# Patient Record
Sex: Female | Born: 1980 | Race: Black or African American | Hispanic: No | Marital: Single | Smoking: Current every day smoker
Health system: Southern US, Community
[De-identification: ages and names within clinical notes are randomized; demographics above are authoritative.]

## PROBLEM LIST (undated history)

## (undated) DIAGNOSIS — F319 Bipolar disorder, unspecified: Secondary | ICD-10-CM

## (undated) DIAGNOSIS — F419 Anxiety disorder, unspecified: Secondary | ICD-10-CM

## (undated) DIAGNOSIS — K219 Gastro-esophageal reflux disease without esophagitis: Secondary | ICD-10-CM

## (undated) DIAGNOSIS — I10 Essential (primary) hypertension: Secondary | ICD-10-CM

## (undated) DIAGNOSIS — G4730 Sleep apnea, unspecified: Secondary | ICD-10-CM

## (undated) DIAGNOSIS — D649 Anemia, unspecified: Secondary | ICD-10-CM

## (undated) DIAGNOSIS — G43909 Migraine, unspecified, not intractable, without status migrainosus: Secondary | ICD-10-CM

## (undated) HISTORY — DX: Migraine, unspecified, not intractable, without status migrainosus: G43.909

## (undated) HISTORY — DX: Sleep apnea, unspecified: G47.30

## (undated) HISTORY — DX: Anemia, unspecified: D64.9

## (undated) HISTORY — DX: Bipolar disorder, unspecified: F31.9

## (undated) HISTORY — DX: Anxiety disorder, unspecified: F41.9

## (undated) HISTORY — DX: Gastro-esophageal reflux disease without esophagitis: K21.9

---

## 2004-03-04 HISTORY — PX: NEPHRECTOMY: SHX65

## 2004-05-24 ENCOUNTER — Inpatient Hospital Stay: Payer: Self-pay | Admitting: Internal Medicine

## 2006-08-15 ENCOUNTER — Emergency Department: Payer: Self-pay | Admitting: Emergency Medicine

## 2007-01-05 ENCOUNTER — Emergency Department: Payer: Self-pay | Admitting: Emergency Medicine

## 2007-04-01 ENCOUNTER — Emergency Department: Payer: Self-pay | Admitting: Emergency Medicine

## 2007-06-07 ENCOUNTER — Other Ambulatory Visit: Payer: Self-pay

## 2007-06-07 ENCOUNTER — Emergency Department: Payer: Self-pay | Admitting: Emergency Medicine

## 2008-09-12 ENCOUNTER — Emergency Department: Payer: Self-pay | Admitting: Emergency Medicine

## 2010-03-04 HISTORY — PX: TUBAL LIGATION: SHX77

## 2010-07-16 ENCOUNTER — Emergency Department: Payer: Self-pay | Admitting: Internal Medicine

## 2010-09-26 ENCOUNTER — Ambulatory Visit: Payer: Self-pay | Admitting: Emergency Medicine

## 2010-10-02 ENCOUNTER — Ambulatory Visit: Payer: Self-pay | Admitting: Emergency Medicine

## 2010-10-03 LAB — PATHOLOGY REPORT

## 2011-03-05 HISTORY — PX: CHOLECYSTECTOMY: SHX55

## 2011-11-30 ENCOUNTER — Emergency Department: Payer: Self-pay | Admitting: Emergency Medicine

## 2011-12-15 ENCOUNTER — Emergency Department: Payer: Self-pay | Admitting: Emergency Medicine

## 2011-12-15 LAB — COMPREHENSIVE METABOLIC PANEL
Alkaline Phosphatase: 64 U/L (ref 50–136)
Anion Gap: 6 — ABNORMAL LOW (ref 7–16)
Bilirubin,Total: 0.4 mg/dL (ref 0.2–1.0)
Chloride: 107 mmol/L (ref 98–107)
Co2: 27 mmol/L (ref 21–32)
EGFR (Non-African Amer.): >60
SGPT (ALT): 21 U/L (ref 12–78)
Sodium: 140 mmol/L (ref 136–145)

## 2011-12-15 LAB — URINALYSIS, COMPLETE
Bilirubin,UR: NEGATIVE
Glucose,UR: NEGATIVE mg/dL (ref 0–75)
Ketone: NEGATIVE
Nitrite: NEGATIVE
Protein: NEGATIVE
RBC,UR: 1 /HPF (ref 0–5)
Specific Gravity: 1.027 (ref 1.003–1.030)
WBC UR: 2 /HPF (ref 0–5)

## 2011-12-15 LAB — CBC
HCT: 30.3 % — ABNORMAL LOW (ref 35.0–47.0)
HGB: 9.4 g/dL — ABNORMAL LOW (ref 12.0–16.0)
RBC: 4.47 10*6/uL (ref 3.80–5.20)

## 2013-04-30 ENCOUNTER — Emergency Department: Payer: Self-pay | Admitting: Emergency Medicine

## 2013-05-02 LAB — BETA STREP CULTURE(ARMC)

## 2013-05-10 ENCOUNTER — Emergency Department: Payer: Self-pay | Admitting: Emergency Medicine

## 2013-12-14 IMAGING — CR LEFT MIDDLE FINGER 2+V
1 series · 3 of 3 positions shown · non-contrast
Comparison: none

REASON FOR EXAM: pain, swelling.
COMMENTS:

PROCEDURE:     DXR - DXR FINGER MID 3RD DIGIT LT HAND  - November 30, 2011 [DATE]
RESULT:     Findings: 3 views of the left third digit demonstrates no
fracture or dislocation. The soft tissues are normal.

[Series 1: x finger pa left · 0.14mm/px · 3 of 3 slices shown]
[im 1/3]
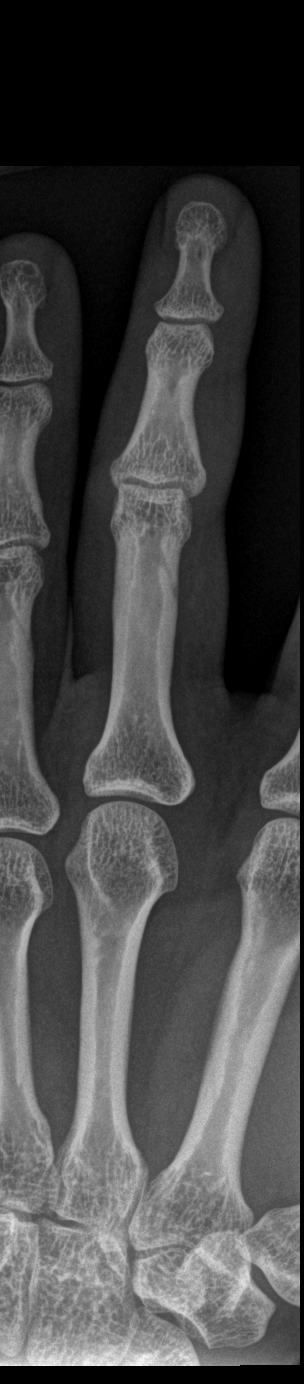
[im 2/3]
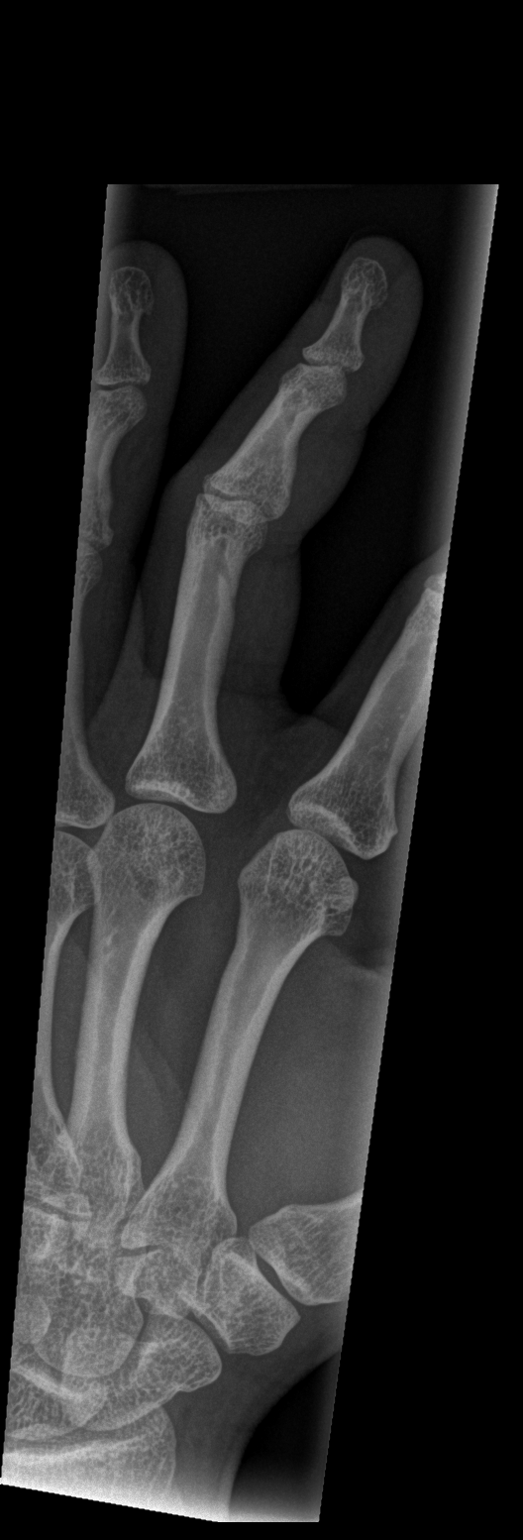
[im 3/3]
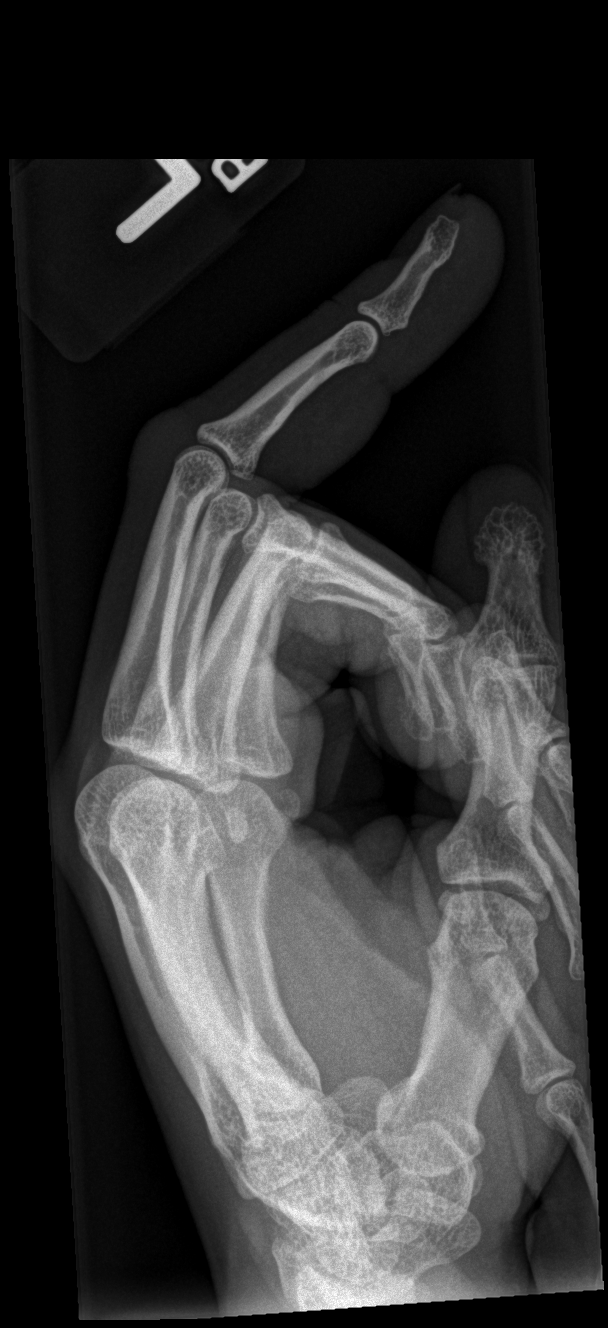

[3 of 3 positions shown; findings below may reference images not displayed]

IMPRESSION: No acute osseous injury left third digit.

## 2015-08-22 ENCOUNTER — Telehealth: Payer: Self-pay | Admitting: Surgery

## 2015-08-22 NOTE — Telephone Encounter (Signed)
[facility], needs patient brought in for I&D of right axillary abscess. Per [facility] nurse, Joice LoftsAmber, patient will be seen at 8am Wednesday 08/23/15

## 2015-08-23 ENCOUNTER — Encounter: Payer: Self-pay | Admitting: Surgery

## 2015-08-23 ENCOUNTER — Ambulatory Visit (INDEPENDENT_AMBULATORY_CARE_PROVIDER_SITE_OTHER): Payer: Self-pay | Admitting: Surgery

## 2015-08-23 VITALS — BP 120/82 | HR 90 | Temp 98.6°F | Ht 68.0 in | Wt 199.2 lb

## 2015-08-23 DIAGNOSIS — L02419 Cutaneous abscess of limb, unspecified: Secondary | ICD-10-CM

## 2015-08-23 HISTORY — PX: ABCESS DRAINAGE: SHX399

## 2015-08-23 NOTE — Progress Notes (Signed)
Patient ID: Rachel Leblanc, female   DOB: Jun 25, 1980, 35 y.o.   MRN: [id number]  History of Present Illness Rachel Leblanc is a 35 y.o. female with a history of right axillary pain and a lump. This progressively getting worse for the last few days. She reports the pain is severe is sharp intermittent and nonradiating. The pain gets worse when she moves her arm. She reports some weakness and and has a history of anemia. No fevers, no evidence of any sepsis  Past Medical History Past Medical History  Diagnosis Date  . Anxiety   . Diabetes mellitus without complication (HCC)   . GERD (gastroesophageal reflux disease)   . Anemia   . Migraines   . Bipolar disorder (HCC)   . Sleep apnea      Past Surgical History  Procedure Laterality Date  . Tubal ligation  2012  . Abcess drainage Right 08/23/15    Axilla- Dr. Everlene FarrierPabon  . Nephrectomy Left 2006  . Cholecystectomy  2013    Laparoscopic    No Known Allergies  Current Outpatient Prescriptions  Medication Sig Dispense Refill  . acetaminophen (TYLENOL) 325 MG tablet Take 650 mg by mouth every 6 (six) hours as needed.    . citalopram (CELEXA) 20 MG tablet Take 20 mg by mouth daily.    . ferrous sulfate 325 (65 FE) MG tablet Take 325 mg by mouth daily with breakfast.    . ibuprofen (ADVIL,MOTRIN) 200 MG tablet Take 600 mg by mouth every 6 (six) hours as needed.    . loratadine (CLARITIN) 10 MG tablet Take 10 mg by mouth daily.    . ranitidine (ZANTAC) 75 MG tablet Take 75 mg by mouth 2 (two) times daily.     No current facility-administered medications for this visit.    Family History Family History  Problem Relation Age of Onset  . Mental illness Mother      Social History Social History  Substance Use Topics  . Smoking status: Former Smoker -- 1.00 packs/day    Types: Cigarettes    Quit date: 08/10/2012  . Smokeless tobacco: Never Used  . Alcohol Use: No     Comment: Heavy in past- Last Use 2014    ROS 10 pt ROS was  performed and is otherwise negative  Physical Exam Blood pressure 120/82, pulse 90, temperature 98.6 F (37 C), temperature source Oral, height 5\' 8"  (1.727 m), weight 90.357 kg (199 lb 3.2 oz).  CONSTITUTIONAL: Acute distress nontoxic EYES: Pupils equal, round, and reactive to light, Sclera non-icteric. EARS, NOSE, MOUTH AND THROAT: The oropharynx is clear. Oral mucosa is pink and moist. Hearing is intact to voice.  NECK: Trachea is midline, and there is no jugular venous distension. Thyroid is without palpable abnormalities. LYMPH NODES:  Lymph nodes in the neck are not enlarged. GI: The abdomen is  soft, nontender, and nondistended. There were no palpable masses. There was no hepatosplenomegaly. There were normal bowel sounds. MUSCULOSKELETAL:  Normal muscle strength and tone in all four extremities.    SKIN: There is a tender area with fluctuance on the right axilla with some erythema. Exquisitely tender to palpation NEUROLOGIC:  Motor and sensation is grossly normal.  Cranial nerves are grossly intact. PSYCH:  Alert and oriented to person, place and time. Affect is normal.  Data Reviewed   I have personally reviewed the patient's imaging and medical records.    Assessment/Plan Axillary abscess and in need for I&D. Discussed with the patient in  detail about the procedure risks benefits and possible complications including but not limited to: Recurrence, bleeding, infection allergy reactions. She understands and a written consent was obtained. PT to pack wound daily. RTC 2 weeks   PROCEDURE NOTE Preoperative diagnosis: Right complex axillary abscess Postoperative diagnosis: Right complex axillary abscess arising from an epidermal inclusion cyst  Procedures: 1. I&D of complex right axillary abscess 2. Baby aspirin of the skin and subcutaneous tissue excisional measuring 3 x 1 cm =3cm2  Anesthesia: Adequate 1% with epinephrine  EBL: Minimal  Patient was prepped and draped in  the usual sterile fashion local anesthetic was infiltrated. An elliptical incision was created over the abscess, pus was drained.We perform an excisional debridement with Metzenbaum scissors and 15 blade knife. The collisions were breaking down with Metzenbaums. Hemostasis obtained with pressure. 1/4 inch packing placed. No complications.  Armonte Tortorella, MD FACS  Rachel Leblanc 08/23/2015, 8:54 AM

## 2015-08-23 NOTE — Patient Instructions (Signed)
Pack area once daily with packing provided.  Follow-up in office in 2 weeks. See appointment below.

## 2015-09-06 ENCOUNTER — Ambulatory Visit: Payer: Self-pay | Admitting: Surgery

## 2015-09-07 ENCOUNTER — Ambulatory Visit (INDEPENDENT_AMBULATORY_CARE_PROVIDER_SITE_OTHER): Payer: Medicaid Other | Admitting: Surgery

## 2015-09-07 ENCOUNTER — Encounter: Payer: Self-pay | Admitting: Surgery

## 2015-09-07 VITALS — BP 125/86 | HR 88 | Temp 97.4°F | Ht 68.0 in | Wt 200.0 lb

## 2015-09-07 DIAGNOSIS — L0390 Cellulitis, unspecified: Secondary | ICD-10-CM | POA: Diagnosis not present

## 2015-09-07 DIAGNOSIS — L0291 Cutaneous abscess, unspecified: Secondary | ICD-10-CM | POA: Diagnosis not present

## 2015-09-07 NOTE — Progress Notes (Signed)
S/p I/D right infected EIC Doing well , no complaints  PE NAD Right axilla, wound almost completely healed, good granulation tissue 2mm x 7 mm No infection   A/P doing well band aid prn RTC prn

## 2015-09-07 NOTE — Patient Instructions (Signed)
Please call our office if you have any questions or concerns.  

## 2016-01-11 ENCOUNTER — Emergency Department
Admission: EM | Admit: 2016-01-11 | Discharge: 2016-01-11 | Disposition: A | Discharge status: Home / Self Care | Payer: Medicaid Other | Attending: Emergency Medicine | Admitting: Emergency Medicine

## 2016-01-11 ENCOUNTER — Encounter: Payer: Self-pay | Admitting: Medical Oncology

## 2016-01-11 DIAGNOSIS — R197 Diarrhea, unspecified: Secondary | ICD-10-CM | POA: Insufficient documentation

## 2016-01-11 DIAGNOSIS — R112 Nausea with vomiting, unspecified: Secondary | ICD-10-CM | POA: Diagnosis not present

## 2016-01-11 DIAGNOSIS — J039 Acute tonsillitis, unspecified: Secondary | ICD-10-CM | POA: Insufficient documentation

## 2016-01-11 DIAGNOSIS — Z791 Long term (current) use of non-steroidal anti-inflammatories (NSAID): Secondary | ICD-10-CM | POA: Diagnosis not present

## 2016-01-11 DIAGNOSIS — Z87891 Personal history of nicotine dependence: Secondary | ICD-10-CM | POA: Insufficient documentation

## 2016-01-11 DIAGNOSIS — E119 Type 2 diabetes mellitus without complications: Secondary | ICD-10-CM | POA: Insufficient documentation

## 2016-01-11 DIAGNOSIS — Z79899 Other long term (current) drug therapy: Secondary | ICD-10-CM | POA: Diagnosis not present

## 2016-01-11 DIAGNOSIS — R509 Fever, unspecified: Secondary | ICD-10-CM | POA: Diagnosis present

## 2016-01-11 LAB — POCT RAPID STREP A: Streptococcus, Group A Screen (Direct): NEGATIVE

## 2016-01-11 MED ORDER — PROMETHAZINE-DM 6.25-15 MG/5ML PO SYRP: 5.0000 mL | ORAL_SOLUTION | Freq: Four times a day (QID) | ORAL | 0 refills | Status: DC | PRN

## 2016-01-11 MED ORDER — ACETAMINOPHEN 500 MG PO TABS
1000.0000 mg | ORAL_TABLET | Freq: Once | ORAL | Status: AC
Start: 2016-01-11 — End: 2016-01-11
  Administered 2016-01-11: 1000 mg via ORAL
  Filled 2016-01-11: qty 2

## 2016-01-11 MED ORDER — PREDNISONE 5 MG PO TABS: 30.0000 mg | ORAL_TABLET | Freq: Every day | ORAL | 0 refills | Status: DC

## 2016-01-11 MED ORDER — AMOXICILLIN 500 MG PO TABS: 500.0000 mg | ORAL_TABLET | Freq: Three times a day (TID) | ORAL | 0 refills | Status: DC

## 2016-01-11 NOTE — ED Provider Notes (Signed)
[facility] Emergency Department Provider Note  ____________________________________________  Time seen: Approximately 11:45 AM  I have reviewed the triage vital signs and the nursing notes.   HISTORY  Chief Complaint Sore Throat; Generalized Body Aches; and Fever    HPI Rachel Leblanc is a 35 y.o. female , NAD, presents with one-day history of sore throat, fever, chills, nausea, vomiting and diarrhea. Patient states she had onset of sore throat and fatigue yesterday. Woke this morning with worsening sore throat and some headache. Has been taking Tylenol which seems to help with fever and aches but only for short time. Has had some nausea today with 1 episode of emesis. Has had loss of appetite but denies abdominal pain. Has had no chest pain, shortness breath, cough, chest congestion. No ear pain or sinus pain. No known sick contacts. Has not had a flu vaccine.   Past Medical History:  Diagnosis Date  . Anemia   . Anxiety   . Bipolar disorder (HCC)   . Diabetes mellitus without complication (HCC)   . GERD (gastroesophageal reflux disease)   . Migraines   . Sleep apnea     There are no active problems to display for this patient.   Past Surgical History:  Procedure Laterality Date  . ABCESS DRAINAGE Right 08/23/15   Axilla- Dr. Everlene Farrier  . CHOLECYSTECTOMY  2013   Laparoscopic  . NEPHRECTOMY Left 2006  . TUBAL LIGATION  2012    Prior to Admission medications   Medication Sig Start Date End Date Taking? Authorizing Provider  acetaminophen (TYLENOL) 325 MG tablet Take 650 mg by mouth every 6 (six) hours as needed.    Historical Provider, MD  amoxicillin (AMOXIL) 500 MG tablet Take 1 tablet (500 mg total) by mouth 3 (three) times daily with meals. 01/11/16   Rosell Khouri L Julyana Woolverton, PA-C  citalopram (CELEXA) 20 MG tablet Take 20 mg by mouth daily.    Historical Provider, MD  ferrous sulfate 325 (65 FE) MG tablet Take 325 mg by mouth daily with breakfast.     Historical Provider, MD  ibuprofen (ADVIL,MOTRIN) 200 MG tablet Take 600 mg by mouth every 6 (six) hours as needed.    Historical Provider, MD  loratadine (CLARITIN) 10 MG tablet Take 10 mg by mouth daily.    Historical Provider, MD  predniSONE (DELTASONE) 5 MG tablet Take 6 tablets (30 mg total) by mouth daily with breakfast. May take for up to 5 days.  Do not take any NSAIDs with this medication. Take with food. 01/11/16   Kinzee Happel L Irby Fails, PA-C  promethazine-dextromethorphan (PROMETHAZINE-DM) 6.25-15 MG/5ML syrup Take 5 mLs by mouth 4 (four) times daily as needed for cough. 01/11/16   Thomasenia Dowse L Shahira Fiske, PA-C  ranitidine (ZANTAC) 75 MG tablet Take 75 mg by mouth 2 (two) times daily.    Historical Provider, MD    Allergies Patient has no known allergies.  Family History  Problem Relation Age of Onset  . Mental illness Mother     Social History Social History  Substance Use Topics  . Smoking status: Former Smoker    Packs/day: 1.00    Types: Cigarettes    Quit date: 08/10/2012  . Smokeless tobacco: Never Used  . Alcohol use No     Comment: Heavy in past- Last Use 2014     Review of Systems  Constitutional: Positive fever, chills, decreased appetite. Eyes: No visual changes. ENT: Positive sore throat. No sinus pressure, ear pain, ear drainage, nasal congestion, runny  nose Cardiovascular: No chest pain. Respiratory: No cough or chest congestion. No shortness of breath. No wheezing.  Gastrointestinal: Positive nausea, vomiting, diarrhea. No abdominal pain, constipation. Musculoskeletal: Negative for general myalgias.  Skin: Negative for rash. Neurological: Positive for headaches, but no focal weakness or numbness. 10-point ROS otherwise negative.  ____________________________________________   PHYSICAL EXAM:  VITAL SIGNS: ED Triage Vitals  Enc Vitals Group     BP 01/11/16 1109 121/81     Pulse Rate 01/11/16 1109 (!) 110     Resp 01/11/16 1109 18     Temp 01/11/16 1109 (!) 100.7  F (38.2 C)     Temp Source 01/11/16 1109 Oral     SpO2 01/11/16 1109 97 %     Weight 01/11/16 1103 197 lb (89.4 kg)     Height 01/11/16 1103 5\' 6"  (1.676 m)     Head Circumference --      Peak Flow --      Pain Score 01/11/16 1104 8     Pain Loc --      Pain Edu? --      Excl. in GC? --      Constitutional: Alert and oriented. Ill appearing but in no acute distress. Eyes: Conjunctivae are normal. PERRL.  Head: Atraumatic. ENT:      Ears: TMs visualized bilaterally without erythema, effusion, bulging.      Nose: No congestion/rhinnorhea.      Mouth/Throat: Mucous membranes are moist. Bilateral tonsils with mild swelling and gray exudate noted about the right tonsil. Uvula is midline. Airway is patent. Neck: No stridor. No cervical spine tenderness to palpation. Supple with full range of motion and no meningismus. Hematological/Lymphatic/Immunilogical: No cervical lymphadenopathy. Cardiovascular: Normal rate, regular rhythm. Normal S1 and S2.  Good peripheral circulation. Respiratory: Normal respiratory effort without tachypnea or retractions. Lungs CTAB with breath sounds noted in all lung fields. No wheeze, rhonchi, rales. Gastrointestinal: Soft and nontender without distention or guarding in all quadrants. Bowel sounds present and normoactive in all quadrants. Musculoskeletal: No lower extremity tenderness nor edema.  No joint effusions. Neurologic:  Normal speech and language. No gross focal neurologic deficits are appreciated.  Skin:  Skin is warm, dry and intact. No rash noted. Psychiatric: Mood and affect are normal. Speech and behavior are normal. Patient exhibits appropriate insight and judgement.   ____________________________________________   LABS (all labs ordered are listed, but only abnormal results are displayed)  Labs Reviewed  POCT RAPID STREP A    ____________________________________________  EKG  None ____________________________________________  RADIOLOGY  None ____________________________________________    PROCEDURES  Procedure(s) performed: None   Procedures   Medications  acetaminophen (TYLENOL) tablet 1,000 mg (1,000 mg Oral Given 01/11/16 1150)     ____________________________________________   INITIAL IMPRESSION / ASSESSMENT AND PLAN / ED COURSE  Pertinent labs & imaging results that were available during my care of the patient were reviewed by me and considered in my medical decision making (see chart for details).  Clinical Course     Patient's diagnosis is consistent with Acute tonsillitis. Patient will be discharged home with prescriptions for amoxicillin, prednisone and promethazine DM to take as directed. [facility] if symptoms persist past this treatment course. Patient is given ED precautions to return to the ED for any worsening or new symptoms.    ____________________________________________  FINAL CLINICAL IMPRESSION(S) / ED DIAGNOSES  Final diagnoses:  Acute tonsillitis, unspecified etiology      NEW MEDICATIONS STARTED  DURING THIS VISIT:  New Prescriptions   AMOXICILLIN (AMOXIL) 500 MG TABLET    Take 1 tablet (500 mg total) by mouth 3 (three) times daily with meals.   PREDNISONE (DELTASONE) 5 MG TABLET    Take 6 tablets (30 mg total) by mouth daily with breakfast. May take for up to 5 days.  Do not take any NSAIDs with this medication. Take with food.   PROMETHAZINE-DEXTROMETHORPHAN (PROMETHAZINE-DM) 6.25-15 MG/5ML SYRUP    Take 5 mLs by mouth 4 (four) times daily as needed for cough.         Hope PigeonJami L Rabiah Goeser, PA-C 01/11/16 1604    Governor Rooksebecca Lord, MD 01/12/16 1135

## 2016-01-11 NOTE — ED Notes (Signed)
Patient presents to the ED with sore throat, cough, fever, vomiting, and diarrhea.  Patient states symptoms began 2 days ago.  Patient reports aching and chills.

## 2016-01-11 NOTE — ED Triage Notes (Signed)
Pt reports sore throat, body aches and fever that began 2 days ago.

## 2016-02-13 ENCOUNTER — Encounter: Payer: Self-pay | Admitting: Emergency Medicine

## 2016-02-13 ENCOUNTER — Emergency Department
Admission: EM | Admit: 2016-02-13 | Discharge: 2016-02-13 | Disposition: A | Discharge status: Home / Self Care | Payer: Medicaid Other | Attending: Emergency Medicine | Admitting: Emergency Medicine

## 2016-02-13 ENCOUNTER — Emergency Department: Payer: Medicaid Other

## 2016-02-13 DIAGNOSIS — R112 Nausea with vomiting, unspecified: Secondary | ICD-10-CM | POA: Diagnosis present

## 2016-02-13 DIAGNOSIS — E119 Type 2 diabetes mellitus without complications: Secondary | ICD-10-CM | POA: Diagnosis not present

## 2016-02-13 DIAGNOSIS — R1010 Upper abdominal pain, unspecified: Secondary | ICD-10-CM

## 2016-02-13 DIAGNOSIS — R52 Pain, unspecified: Secondary | ICD-10-CM

## 2016-02-13 DIAGNOSIS — Z87891 Personal history of nicotine dependence: Secondary | ICD-10-CM | POA: Insufficient documentation

## 2016-02-13 LAB — CBC WITH DIFFERENTIAL/PLATELET
Basophils Absolute: 0 10*3/uL (ref 0–0.1)
Basophils Relative: 1 %
Eosinophils Absolute: 0.3 10*3/uL (ref 0–0.7)
Eosinophils Relative: 5 %
HCT: 30.9 % — ABNORMAL LOW (ref 35.0–47.0)
HEMOGLOBIN: 9.7 g/dL — AB (ref 12.0–16.0)
LYMPHS ABS: 2 10*3/uL (ref 1.0–3.6)
LYMPHS PCT: 34 %
MCH: 21.7 pg — AB (ref 26.0–34.0)
MCHC: 31.6 g/dL — ABNORMAL LOW (ref 32.0–36.0)
MCV: 68.8 fL — AB (ref 80.0–100.0)
Monocytes Absolute: 0.5 10*3/uL (ref 0.2–0.9)
Monocytes Relative: 8 %
NEUTROS PCT: 52 %
Neutro Abs: 3 10*3/uL (ref 1.4–6.5)
Platelets: 374 10*3/uL (ref 150–440)
RBC: 4.48 MIL/uL (ref 3.80–5.20)
RDW: 18.6 % — ABNORMAL HIGH (ref 11.5–14.5)
WBC: 5.7 10*3/uL (ref 3.6–11.0)

## 2016-02-13 LAB — URINALYSIS, COMPLETE (UACMP) WITH MICROSCOPIC
BILIRUBIN URINE: NEGATIVE
Glucose, UA: NEGATIVE mg/dL
HGB URINE DIPSTICK: NEGATIVE
Ketones, ur: 5 mg/dL — AB
NITRITE: NEGATIVE
PROTEIN: 30 mg/dL — AB
Specific Gravity, Urine: 1.023 (ref 1.005–1.030)
pH: 5 (ref 5.0–8.0)

## 2016-02-13 LAB — COMPREHENSIVE METABOLIC PANEL
ALT: 27 U/L (ref 14–54)
ANION GAP: 6 (ref 5–15)
AST: 34 U/L (ref 15–41)
Albumin: 3.6 g/dL (ref 3.5–5.0)
Alkaline Phosphatase: 43 U/L (ref 38–126)
BUN: 7 mg/dL (ref 6–20)
CHLORIDE: 107 mmol/L (ref 101–111)
CO2: 23 mmol/L (ref 22–32)
Calcium: 8.6 mg/dL — ABNORMAL LOW (ref 8.9–10.3)
Creatinine, Ser: 0.85 mg/dL (ref 0.44–1.00)
GFR calc non Af Amer: >60 mL/min (ref >60)
Glucose, Bld: 121 mg/dL — ABNORMAL HIGH (ref 65–99)
Potassium: 2.9 mmol/L — ABNORMAL LOW (ref 3.5–5.1)
SODIUM: 136 mmol/L (ref 135–145)
Total Bilirubin: 0.3 mg/dL (ref 0.3–1.2)
Total Protein: 7.1 g/dL (ref 6.5–8.1)

## 2016-02-13 LAB — LIPASE, BLOOD: Lipase: 40 U/L (ref 11–51)

## 2016-02-13 LAB — PREGNANCY, URINE: Preg Test, Ur: NEGATIVE

## 2016-02-13 LAB — POCT PREGNANCY, URINE: PREG TEST UR: NEGATIVE

## 2016-02-13 MED ORDER — RANITIDINE HCL 150 MG PO TABS: 150.0000 mg | ORAL_TABLET | Freq: Two times a day (BID) | ORAL | 1 refills | Status: DC

## 2016-02-13 MED ORDER — IPRATROPIUM-ALBUTEROL 0.5-2.5 (3) MG/3ML IN SOLN
RESPIRATORY_TRACT | Status: AC
  Administered 2016-02-13: 3 mL via RESPIRATORY_TRACT
  Filled 2016-02-13: qty 3

## 2016-02-13 MED ORDER — ACETAMINOPHEN 500 MG PO TABS
1000.0000 mg | ORAL_TABLET | Freq: Once | ORAL | Status: AC
  Administered 2016-02-13: 1000 mg via ORAL

## 2016-02-13 MED ORDER — ACETAMINOPHEN 500 MG PO TABS
ORAL_TABLET | ORAL | Status: AC
  Administered 2016-02-13: 1000 mg via ORAL
  Filled 2016-02-13: qty 2

## 2016-02-13 MED ORDER — GI COCKTAIL ~~LOC~~
30.0000 mL | Freq: Once | ORAL | Status: AC
  Administered 2016-02-13: 30 mL via ORAL
  Filled 2016-02-13: qty 30

## 2016-02-13 MED ORDER — IPRATROPIUM-ALBUTEROL 0.5-2.5 (3) MG/3ML IN SOLN
3.0000 mL | Freq: Once | RESPIRATORY_TRACT | Status: AC
  Administered 2016-02-13: 3 mL via RESPIRATORY_TRACT

## 2016-02-13 NOTE — ED Notes (Signed)
Patient transported to X-ray 

## 2016-02-13 NOTE — ED Notes (Signed)
Pt resting in bed, resp even and unlabored, eyes closed 

## 2016-02-13 NOTE — ED Notes (Signed)
Pt returned from  Xray, resting in bed in no acute distress

## 2016-02-13 NOTE — ED Triage Notes (Signed)
Pt to ed with c/o abd pain and n/v/d since Sunday.  Pt states yellow vomitus.

## 2016-02-13 NOTE — ED Notes (Signed)
Pt resting in bed with eyes closed, resp even and unlabored.

## 2016-02-13 NOTE — Discharge Instructions (Signed)
Since the pain went away with the GI cocktail and think he may have had some gastritis or something similar. I will give you some Zantac. Please take one twice a day for the next several weeks. Please follow-up with your doctor. Please return here if you're worse at all especially for fever or vomiting. He don't have a regular doctor please [facility] or [facility], [facility], [facility] or [facility].

## 2016-02-13 NOTE — ED Provider Notes (Signed)
[facility] Emergency Department Provider Note   ____________________________________________   First MD Initiated Contact with Patient 02/13/16 0840     (approximate)  I have reviewed the triage vital signs and the nursing notes.   HISTORY  Chief Complaint Abdominal Pain   HPI Rachel Leblanc is a 35 y.o. female who reports nausea vomiting and diarrhea since Sunday. She is also complained of cough productive of small amounts of sputum. Main complaint however is epigastric pain. This resolved with GI cocktail. During the time the patient is waiting for the blood work to come back and then waiting for me to get done with other patients patient falls asleep she has no nausea vomiting or diarrhea no further coughing in the epigastric pain as I said resolved.   Past Medical History:  Diagnosis Date  . Anemia   . Anxiety   . Bipolar disorder (HCC)   . Diabetes mellitus without complication (HCC)   . GERD (gastroesophageal reflux disease)   . Migraines   . Sleep apnea     There are no active problems to display for this patient.   Past Surgical History:  Procedure Laterality Date  . ABCESS DRAINAGE Right 08/23/15   Axilla- Dr. Everlene Farrier  . CHOLECYSTECTOMY  2013   Laparoscopic  . NEPHRECTOMY Left 2006  . TUBAL LIGATION  2012    Prior to Admission medications   Medication Sig Start Date End Date Taking? Authorizing Provider  acetaminophen (TYLENOL) 325 MG tablet Take 650 mg by mouth every 6 (six) hours as needed.    Historical Provider, MD  amoxicillin (AMOXIL) 500 MG tablet Take 1 tablet (500 mg total) by mouth 3 (three) times daily with meals. 01/11/16   Jami L Hagler, PA-C  citalopram (CELEXA) 20 MG tablet Take 20 mg by mouth daily.    Historical Provider, MD  ferrous sulfate 325 (65 FE) MG tablet Take 325 mg by mouth daily with breakfast.    Historical Provider, MD  ibuprofen (ADVIL,MOTRIN) 200 MG tablet Take 600 mg by mouth every 6 (six) hours as  needed.    Historical Provider, MD  loratadine (CLARITIN) 10 MG tablet Take 10 mg by mouth daily.    Historical Provider, MD  predniSONE (DELTASONE) 5 MG tablet Take 6 tablets (30 mg total) by mouth daily with breakfast. May take for up to 5 days.  Do not take any NSAIDs with this medication. Take with food. 01/11/16   Jami L Hagler, PA-C  promethazine-dextromethorphan (PROMETHAZINE-DM) 6.25-15 MG/5ML syrup Take 5 mLs by mouth 4 (four) times daily as needed for cough. 01/11/16   Jami L Hagler, PA-C  ranitidine (ZANTAC) 150 MG tablet Take 1 tablet (150 mg total) by mouth 2 (two) times daily. 02/13/16 02/12/17  Arnaldo Natal, MD    Allergies Patient has no known allergies.  Family History  Problem Relation Age of Onset  . Mental illness Mother     Social History Social History  Substance Use Topics  . Smoking status: Former Smoker    Packs/day: 1.00    Types: Cigarettes    Quit date: 08/10/2012  . Smokeless tobacco: Never Used  . Alcohol use No     Comment: Heavy in past- Last Use 2014    Review of Systems Constitutional: No fever Eyes: No visual changes. ENT: No sore throat. Cardiovascular: Denies chest pain. Respiratory: Denies shortness of breath. Gastrointestinal: See history of present illness Genitourinary: Negative for dysuria. Musculoskeletal: Negative for back pain. Skin: Negative for  rash.  10-point ROS otherwise negative.  ____________________________________________   PHYSICAL EXAM:  VITAL SIGNS: ED Triage Vitals  Enc Vitals Group     BP 02/13/16 0822 (!) 133/91     Pulse Rate 02/13/16 0822 95     Resp 02/13/16 0822 20     Temp 02/13/16 0822 99.2 F (37.3 C)     Temp Source 02/13/16 0822 Oral     SpO2 02/13/16 0822 100 %     Weight 02/13/16 0823 197 lb (89.4 kg)     Height --      Head Circumference --      Peak Flow --      Pain Score 02/13/16 0823 8     Pain Loc --      Pain Edu? --      Excl. in GC? --     Constitutional: Alert and oriented.  Well appearing and in no acute distress. Eyes: Conjunctivae are normal. PERRL. EOMI. Head: Atraumatic. Nose: No congestion/rhinnorhea. Mouth/Throat: Mucous membranes are moist.  Oropharynx non-erythematous. Neck: No stridor.   Cardiovascular: Normal rate, regular rhythm. Grossly normal heart sounds.  Good peripheral circulation. Respiratory: Normal respiratory effort.  No retractions. Lungs CTAB. Gastrointestinal: Soft and nontender except in epigastric area. Epigastrium is tender to palpation but not to percussion.. No distention. No abdominal bruits. No CVA tenderness. Musculoskeletal: No lower extremity tenderness nor edema.  No joint effusions. Skin:  Skin is warm, dry and intact. No rash noted.  ____________________________________________   LABS (all labs ordered are listed, but only abnormal results are displayed)  Labs Reviewed  URINALYSIS, COMPLETE (UACMP) WITH MICROSCOPIC - Abnormal; Notable for the following:       Result Value   Color, Urine YELLOW (*)    APPearance HAZY (*)    Ketones, ur 5 (*)    Protein, ur 30 (*)    Leukocytes, UA SMALL (*)    Bacteria, UA RARE (*)    Squamous Epithelial / LPF 6-30 (*)    All other components within normal limits  COMPREHENSIVE METABOLIC PANEL - Abnormal; Notable for the following:    Potassium 2.9 (*)    Glucose, Bld 121 (*)    Calcium 8.6 (*)    All other components within normal limits  CBC WITH DIFFERENTIAL/PLATELET - Abnormal; Notable for the following:    Hemoglobin 9.7 (*)    HCT 30.9 (*)    MCV 68.8 (*)    MCH 21.7 (*)    MCHC 31.6 (*)    RDW 18.6 (*)    All other components within normal limits  GASTROINTESTINAL PANEL BY PCR, STOOL (REPLACES STOOL CULTURE)  PREGNANCY, URINE  LIPASE, BLOOD  POCT PREGNANCY, URINE   ____________________________________________  EKG   ____________________________________________  RADIOLOGY Study Result   CLINICAL DATA:  Abdominal pain with nausea and vomiting for 2  days, initial encounter  EXAM: ABDOMEN - 2 VIEW  COMPARISON:  None.  FINDINGS: Scattered large and small bowel gas is noted. No obstructive changes or free air are noted. No abnormal mass or abnormal calcifications are seen. 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  IMPRESSION: No acute abnormality noted.   Electronically Signed   By: Alcide CleverMark  Lukens M.D.   On: 02/13/2016 09:26    Study Result   CLINICAL DATA:  Productive cough and fever  EXAM: CHEST  2 VIEW  COMPARISON:  10/02/2010  FINDINGS: The heart size and mediastinal contours are within normal limits. Both lungs are clear. The visualized skeletal structures  are unremarkable.  IMPRESSION: No active cardiopulmonary disease.   Electronically Signed   By: Alcide CleverMark  Lukens M.D.   On: 02/13/2016 09:21     ____________________________________________   PROCEDURES  Procedure(s) performed:   Procedures  Critical Care performed:   ____________________________________________   INITIAL IMPRESSION / ASSESSMENT AND PLAN / ED COURSE  Pertinent labs & imaging results that were available during my care of the patient were reviewed by me and considered in my medical decision making (see chart for details).    Clinical Course      ____________________________________________   FINAL CLINICAL IMPRESSION(S) / ED DIAGNOSES  Final diagnoses:  Pain  Pain of upper abdomen      NEW MEDICATIONS STARTED DURING THIS VISIT:  Discharge Medication List as of 02/13/2016 12:32 PM       Note:  This document was prepared using Dragon voice recognition software and may include unintentional dictation errors.    Arnaldo NatalPaul F Teejay Meader, MD 02/13/16 [phone]

## 2016-12-24 ENCOUNTER — Emergency Department
Admission: EM | Admit: 2016-12-24 | Discharge: 2016-12-24 | Disposition: A | Discharge status: Home / Self Care | Payer: Medicaid Other | Attending: Emergency Medicine | Admitting: Emergency Medicine

## 2016-12-24 ENCOUNTER — Encounter: Payer: Self-pay | Admitting: Emergency Medicine

## 2016-12-24 DIAGNOSIS — Z79899 Other long term (current) drug therapy: Secondary | ICD-10-CM | POA: Insufficient documentation

## 2016-12-24 DIAGNOSIS — E119 Type 2 diabetes mellitus without complications: Secondary | ICD-10-CM | POA: Insufficient documentation

## 2016-12-24 DIAGNOSIS — F17210 Nicotine dependence, cigarettes, uncomplicated: Secondary | ICD-10-CM | POA: Diagnosis not present

## 2016-12-24 DIAGNOSIS — L02411 Cutaneous abscess of right axilla: Secondary | ICD-10-CM | POA: Diagnosis present

## 2016-12-24 LAB — COMPREHENSIVE METABOLIC PANEL
ALK PHOS: 31 U/L — AB (ref 38–126)
ALT: 12 U/L — AB (ref 14–54)
ANION GAP: 8 (ref 5–15)
AST: 16 U/L (ref 15–41)
Albumin: 3.8 g/dL (ref 3.5–5.0)
BILIRUBIN TOTAL: 0.2 mg/dL — AB (ref 0.3–1.2)
BUN: 16 mg/dL (ref 6–20)
CALCIUM: 8.9 mg/dL (ref 8.9–10.3)
CO2: 25 mmol/L (ref 22–32)
CREATININE: 0.86 mg/dL (ref 0.44–1.00)
Chloride: 105 mmol/L (ref 101–111)
Glucose, Bld: 106 mg/dL — ABNORMAL HIGH (ref 65–99)
Potassium: 3.3 mmol/L — ABNORMAL LOW (ref 3.5–5.1)
Sodium: 138 mmol/L (ref 135–145)
TOTAL PROTEIN: 7.2 g/dL (ref 6.5–8.1)

## 2016-12-24 LAB — CBC WITH DIFFERENTIAL/PLATELET
BASOS ABS: 0.1 10*3/uL (ref 0–0.1)
BASOS PCT: 1 %
EOS ABS: 0.2 10*3/uL (ref 0–0.7)
Eosinophils Relative: 3 %
HCT: 28 % — ABNORMAL LOW (ref 35.0–47.0)
HEMOGLOBIN: 8.7 g/dL — AB (ref 12.0–16.0)
Lymphocytes Relative: 49 %
Lymphs Abs: 2.6 10*3/uL (ref 1.0–3.6)
MCH: 20.6 pg — ABNORMAL LOW (ref 26.0–34.0)
MCHC: 31.2 g/dL — AB (ref 32.0–36.0)
MCV: 66.2 fL — ABNORMAL LOW (ref 80.0–100.0)
Monocytes Absolute: 0.4 10*3/uL (ref 0.2–0.9)
Monocytes Relative: 8 %
NEUTROS ABS: 2 10*3/uL (ref 1.4–6.5)
NEUTROS PCT: 39 %
Platelets: 362 10*3/uL (ref 150–440)
RBC: 4.22 MIL/uL (ref 3.80–5.20)
RDW: 21.1 % — ABNORMAL HIGH (ref 11.5–14.5)
WBC: 5.2 10*3/uL (ref 3.6–11.0)

## 2016-12-24 LAB — LACTIC ACID, PLASMA: Lactic Acid, Venous: 0.7 mmol/L (ref 0.5–1.9)

## 2016-12-24 MED ORDER — CLINDAMYCIN HCL 300 MG PO CAPS: 300.0000 mg | ORAL_CAPSULE | Freq: Three times a day (TID) | ORAL | 0 refills | Status: AC

## 2016-12-24 MED ORDER — HYDROCODONE-ACETAMINOPHEN 5-325 MG PO TABS: 1.0000 | ORAL_TABLET | Freq: Four times a day (QID) | ORAL | 0 refills | Status: AC | PRN

## 2016-12-24 MED ORDER — HYDROCODONE-ACETAMINOPHEN 5-325 MG PO TABS
1.0000 | ORAL_TABLET | Freq: Once | ORAL | Status: AC
  Administered 2016-12-24: 1 via ORAL

## 2016-12-24 MED ORDER — HYDROCODONE-ACETAMINOPHEN 5-325 MG PO TABS
ORAL_TABLET | ORAL | Status: AC
  Filled 2016-12-24: qty 1

## 2016-12-24 NOTE — ED Provider Notes (Signed)
[facility] Emergency Department Provider Note  ____________________________________________  Time seen: Approximately 6:43 PM  I have reviewed the triage vital signs and the nursing notes.   HISTORY  Chief Complaint Abscess    HPI Rachel EasterlyKameeka R Leblanc is a 36 y.o. female presents to the emergency department with a right axillary abscess. Patient reports that she underwent incision and drainage for an abscess in a similar location approximately one year ago. Patient states that cyst subsequently developed and has recently become painful. She denies fever or chills. No alleviating measures have been attempted.   Past Medical History:  Diagnosis Date  . Anemia   . Anxiety   . Bipolar disorder (HCC)   . Diabetes mellitus without complication (HCC)   . GERD (gastroesophageal reflux disease)   . Migraines   . Sleep apnea     There are no active problems to display for this patient.   Past Surgical History:  Procedure Laterality Date  . ABCESS DRAINAGE Right 08/23/15   Axilla- Dr. Everlene FarrierPabon  . CHOLECYSTECTOMY  2013   Laparoscopic  . NEPHRECTOMY Left 2006  . TUBAL LIGATION  2012    Prior to Admission medications   Medication Sig Start Date End Date Taking? Authorizing Provider  acetaminophen (TYLENOL) 325 MG tablet Take 650 mg by mouth every 6 (six) hours as needed.    [provider]  amoxicillin (AMOXIL) 500 MG tablet Take 1 tablet (500 mg total) by mouth 3 (three) times daily with meals. 01/11/16   Hagler, Jami L, PA-C  citalopram (CELEXA) 20 MG tablet Take 20 mg by mouth daily.    [provider]  clindamycin (CLEOCIN) 300 MG capsule Take 1 capsule (300 mg total) by mouth 3 (three) times daily. 12/24/16 01/03/17  Orvil FeilWoods, Quinci Gavidia M, PA-C  ferrous sulfate 325 (65 FE) MG tablet Take 325 mg by mouth daily with breakfast.    [provider]  HYDROcodone-acetaminophen (NORCO) 5-325 MG tablet Take 1 tablet by mouth every 6 (six) hours as  needed for moderate pain. 12/24/16 12/26/16  Orvil FeilWoods, Sanye Ledesma M, PA-C  ibuprofen (ADVIL,MOTRIN) 200 MG tablet Take 600 mg by mouth every 6 (six) hours as needed.    [provider]  loratadine (CLARITIN) 10 MG tablet Take 10 mg by mouth daily.    [provider]  predniSONE (DELTASONE) 5 MG tablet Take 6 tablets (30 mg total) by mouth daily with breakfast. May take for up to 5 days.  Do not take any NSAIDs with this medication. Take with food. 01/11/16   Hagler, Jami L, PA-C  promethazine-dextromethorphan (PROMETHAZINE-DM) 6.25-15 MG/5ML syrup Take 5 mLs by mouth 4 (four) times daily as needed for cough. 01/11/16   Hagler, Jami L, PA-C  ranitidine (ZANTAC) 150 MG tablet Take 1 tablet (150 mg total) by mouth 2 (two) times daily. 02/13/16 02/12/17  Arnaldo NatalMalinda, Paul F, MD    Allergies Patient has no known allergies.  Family History  Problem Relation Age of Onset  . Mental illness Mother     Social History Social History  Substance Use Topics  . Smoking status: Current Every Day Smoker    Packs/day: 0.50    Types: Cigarettes  . Smokeless tobacco: Never Used  . Alcohol use No     Comment: Heavy in past- Last Use 2014     Review of Systems  Constitutional: No fever/chills Eyes: No visual changes. No discharge ENT: No upper respiratory complaints. Cardiovascular: no chest pain. Respiratory: no cough. No SOB. Gastrointestinal: No  abdominal pain.  No nausea, no vomiting.  No diarrhea.  No constipation. Musculoskeletal: Negative for musculoskeletal pain. Skin: Patient has right axillary abscess.  Neurological: Negative for headaches, focal weakness or numbness.   ____________________________________________   PHYSICAL EXAM:  VITAL SIGNS: ED Triage Vitals [12/24/16 1812]  Enc Vitals Group     BP 138/82     Pulse Rate 96     Resp 18     Temp 98.1 F (36.7 C)     Temp Source Oral     SpO2 100 %     Weight      Height 5\' 6"  (1.676 m)     Head Circumference       Peak Flow      Pain Score 7     Pain Loc      Pain Edu?      Excl. in GC?      Constitutional: Alert and oriented. Well appearing and in no acute distress. Eyes: Conjunctivae are normal. PERRL. EOMI. Head: Atraumatic. Cardiovascular: Normal rate, regular rhythm. Normal S1 and S2.  Good peripheral circulation. Respiratory: Normal respiratory effort without tachypnea or retractions. Lungs CTAB. Good air entry to the bases with no decreased or absent breath sounds. Musculoskeletal: Full range of motion to all extremities. No gross deformities appreciated. Neurologic:  Normal speech and language. No gross focal neurologic deficits are appreciated.  Skin: Patient has a 3cm x 3cm right axillary abscess with no surrounding cellulitis.  Psychiatric: Mood and affect are normal. Speech and behavior are normal. Patient exhibits appropriate insight and judgement.   ____________________________________________   LABS (all labs ordered are listed, but only abnormal results are displayed)  Labs Reviewed  COMPREHENSIVE METABOLIC PANEL - Abnormal; Notable for the following:       Result Value   Potassium 3.3 (*)    Glucose, Bld 106 (*)    ALT 12 (*)    Alkaline Phosphatase 31 (*)    Total Bilirubin 0.2 (*)    All other components within normal limits  CBC WITH DIFFERENTIAL/PLATELET - Abnormal; Notable for the following:    Hemoglobin 8.7 (*)    HCT 28.0 (*)    MCV 66.2 (*)    MCH 20.6 (*)    MCHC 31.2 (*)    RDW 21.1 (*)    All other components within normal limits  LACTIC ACID, PLASMA  LACTIC ACID, PLASMA   ____________________________________________  EKG   ____________________________________________  RADIOLOGY   No results found.  ____________________________________________    PROCEDURES  Procedure(s) performed:    Procedures  INCISION AND DRAINAGE Performed by: Orvil Feil Consent: Verbal consent obtained. Risks and benefits: risks, benefits and  alternatives were discussed Type: abscess  Body area: Right axilla   Anesthesia: local infiltration  Incision was made with a scalpel.  Local anesthetic: lidocaine 1% without epinephrine  Anesthetic total: 3 ml  Complexity: complex Blunt dissection to break up loculations  Drainage: purulent  Drainage amount: Copious  Patient tolerance: Patient tolerated the procedure well with no immediate complications.   Medications  HYDROcodone-acetaminophen (NORCO/VICODIN) 5-325 MG per tablet 1 tablet (1 tablet Oral Given 12/24/16 2001)     ____________________________________________   INITIAL IMPRESSION / ASSESSMENT AND PLAN / ED COURSE  Pertinent labs & imaging results that were available during my care of the patient were reviewed by me and considered in my medical decision making (see chart for details).  Review of the [place] CSRS was performed in accordance of the [facility] prior  to dispensing any controlled drugs.     Assessment and Plan: Right axillary abscess Differential diagnosis includes cellulitis versus abscess.  Patient presents to the emergency department with a right axillary abscess. Patient underwent incision and drainage in the emergency department. She was discharged with clindamycin. Patient was advised to follow-up with primary care as needed. All patient questions were answered.  ____________________________________________  FINAL CLINICAL IMPRESSION(S) / ED DIAGNOSES  Final diagnoses:  Abscess of axilla, right      NEW MEDICATIONS STARTED DURING THIS VISIT:  Discharge Medication List as of 12/24/2016  7:44 PM    START taking these medications   Details  clindamycin (CLEOCIN) 300 MG capsule Take 1 capsule (300 mg total) by mouth 3 (three) times daily., Starting Tue 12/24/2016, Until Fri 01/03/2017, Print            This chart was dictated using voice recognition software/Dragon. Despite best efforts to proofread, errors can occur which can  change the meaning. Any change was purely unintentional.    Gasper Lloyd 12/24/16 2009    Merrily Brittle, MD 12/24/16 2344

## 2016-12-24 NOTE — ED Notes (Signed)
Patient states she had cyst un der right arm drained and packed last year. Cyst was not removed, filling of area started about a week ago. Patient states " Area Originally hard, now feels like putty, and is softer"

## 2016-12-24 NOTE — ED Triage Notes (Signed)
Patient presents to ED via POV from home with c/o abscess under right arm.

## 2017-02-10 ENCOUNTER — Encounter: Payer: Self-pay | Admitting: Obstetrics and Gynecology

## 2017-04-03 ENCOUNTER — Encounter: Payer: Self-pay | Admitting: Emergency Medicine

## 2017-04-03 ENCOUNTER — Emergency Department
Admission: EM | Admit: 2017-04-03 | Discharge: 2017-04-03 | Disposition: A | Discharge status: Home / Self Care | Payer: Medicaid Other | Attending: Emergency Medicine | Admitting: Emergency Medicine

## 2017-04-03 DIAGNOSIS — M436 Torticollis: Secondary | ICD-10-CM | POA: Insufficient documentation

## 2017-04-03 DIAGNOSIS — M62838 Other muscle spasm: Secondary | ICD-10-CM | POA: Diagnosis not present

## 2017-04-03 DIAGNOSIS — Z79899 Other long term (current) drug therapy: Secondary | ICD-10-CM | POA: Diagnosis not present

## 2017-04-03 DIAGNOSIS — E119 Type 2 diabetes mellitus without complications: Secondary | ICD-10-CM | POA: Insufficient documentation

## 2017-04-03 DIAGNOSIS — F17210 Nicotine dependence, cigarettes, uncomplicated: Secondary | ICD-10-CM | POA: Insufficient documentation

## 2017-04-03 DIAGNOSIS — M542 Cervicalgia: Secondary | ICD-10-CM | POA: Diagnosis present

## 2017-04-03 MED ORDER — PREDNISONE 10 MG (21) PO TBPK: ORAL_TABLET | ORAL | 0 refills | Status: DC

## 2017-04-03 MED ORDER — HYDROCODONE-ACETAMINOPHEN 5-325 MG PO TABS
1.0000 | ORAL_TABLET | Freq: Once | ORAL | Status: AC
Start: 2017-04-03 — End: 2017-04-03
  Administered 2017-04-03: 1 via ORAL
  Filled 2017-04-03: qty 1

## 2017-04-03 MED ORDER — BACLOFEN 10 MG PO TABS: 10.0000 mg | ORAL_TABLET | Freq: Every day | ORAL | 1 refills | Status: DC

## 2017-04-03 NOTE — ED Triage Notes (Signed)
Patient presents to ED via POV from home with c/o neck pain x 2 days. Patient also reports left arm pain, in deltoid area. Patient reports her normal speech. Patient denies recent injury or trauma. Patient also reports cough.

## 2017-04-03 NOTE — ED Notes (Addendum)
See triage note  Presents with neck pain which started 2 days ago  States the initial pain was a "sharp" pain to left arm  Then pain moved into neck  Pain has been constant but has changed in intensity   Also having some discomfort in chest with cough afebrile on arrival   Subjective fever at home

## 2017-04-03 NOTE — ED Provider Notes (Signed)
[facility] Emergency Department Provider Note  ____________________________________________   First MD Initiated Contact with Patient 04/03/17 1131     (approximate)  I have reviewed the triage vital signs and the nursing notes.   HISTORY  Chief Complaint Neck Pain    HPI Rachel Leblanc is a 37 y.o. female who is complaining of left-sided neck pain which started 2 days ago.  She states it was a sharp pain to the left arm and continues into her neck.  She has trouble moving her neck.  She states she had some chest discomfort with a cough but she denies any type of chest pain or shortness of breath.  She denies fever chills she denies any injury to the left arm.  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  Past Medical History:  Diagnosis Date  . Anemia   . Anxiety   . Bipolar disorder (HCC)   . Diabetes mellitus without complication (HCC)   . GERD (gastroesophageal reflux disease)   . Migraines   . Sleep apnea     There are no active problems to display for this patient.   Past Surgical History:  Procedure Laterality Date  . ABCESS DRAINAGE Right 08/23/15   Axilla- Dr. Everlene Farrier  . CHOLECYSTECTOMY  2013   Laparoscopic  . NEPHRECTOMY Left 2006  . TUBAL LIGATION  2012    Prior to Admission medications   Medication Sig Start Date End Date Taking? Authorizing Provider  acetaminophen (TYLENOL) 325 MG tablet Take 650 mg by mouth every 6 (six) hours as needed.    [provider]  baclofen (LIORESAL) 10 MG tablet Take 1 tablet (10 mg total) by mouth daily. 04/03/17 04/03/18  Fisher, Roselyn Bering, PA-C  citalopram (CELEXA) 20 MG tablet Take 20 mg by mouth daily.    [provider]  ferrous sulfate 325 (65 FE) MG tablet Take 325 mg by mouth daily with breakfast.    [provider]  ibuprofen (ADVIL,MOTRIN) 200 MG tablet Take 600 mg by mouth every 6 (six) hours as needed.    [provider]    loratadine (CLARITIN) 10 MG tablet Take 10 mg by mouth daily.    [provider]  predniSONE (STERAPRED UNI-PAK 21 TAB) 10 MG (21) TBPK tablet Take 6 pills on day one then decrease by 1 pill each day 04/03/17   Faythe Ghee, PA-C    Allergies Patient has no known allergies.  Family History  Problem Relation Age of Onset  . Mental illness Mother     Social History Social History   Tobacco Use  . Smoking status: Current Every Day Smoker    Packs/day: 0.50    Types: Cigarettes  . Smokeless tobacco: Never Used  Substance Use Topics  . Alcohol use: No    Comment: Heavy in past- Last Use 2014  . Drug use: Yes    Types: Marijuana    Review of Systems  Constitutional: No fever/chills Eyes: No visual changes. ENT: No sore throat. Neck: Complains of neck pain Respiratory: Positive cough Cardiac: Denies chest pain Genitourinary: Negative for dysuria. Musculoskeletal: Negative for back pain.  Positive for left shoulder and left-sided neck pain with radiation to the left arm Skin: Negative for rash.    ____________________________________________   PHYSICAL EXAM:  VITAL SIGNS: ED Triage Vitals [04/03/17 1029]  Enc Vitals Group     BP 138/68     Pulse Rate 100  Resp 15     Temp 98.7 F (37.1 C)     Temp Source Oral     SpO2 100 %     Weight 174 lb (78.9 kg)     Height 5\' 6"  (1.676 m)     Head Circumference      Peak Flow      Pain Score 10     Pain Loc      Pain Edu?      Excl. in GC?     Constitutional: Alert and oriented. Well appearing and in no acute distress.  Patient is able to answer questions in an appropriate manner Eyes: Conjunctivae are normal.  Head: Atraumatic. Nose: No congestion/rhinnorhea. Mouth/Throat: Mucous membranes are moist.   Neck: Is supple.  No lymphadenopathy is noted Cardiovascular: Normal rate, regular rhythm.  Heart sounds are normal Respiratory: Normal respiratory effort.  No retractions, lungs clear to  auscultation GU: deferred Musculoskeletal: Decreased range of motion of the left arm and shoulder.  Patient is unable to completely reach overhead.  Pain is reproduced at chest level.  She is able to internally rotate the left arm.  Pain is reproduced with Hawks sign.  Grips are equal bilaterally Neurologic:  Normal speech and language.  Cranial nerves II through XII are grossly intact Skin:  Skin is warm, dry and intact. No rash noted. Psychiatric: Mood and affect are normal. Speech and behavior are normal.  ____________________________________________   LABS (all labs ordered are listed, but only abnormal results are displayed)  Labs Reviewed - No data to display ____________________________________________   ____________________________________________  RADIOLOGY    ____________________________________________   PROCEDURES  Procedure(s) performed: No  Procedures    ____________________________________________   INITIAL IMPRESSION / ASSESSMENT AND PLAN / ED COURSE  Pertinent labs & imaging results that were available during my care of the patient were reviewed by me and considered in my medical decision making (see chart for details).  Patient is 37 year old female complaining of left shoulder pain.  She states that the pain is reproduced with movement.  She denies any injury.  She denies cardiac type chest pain or shortness of breath.  On physical exam the patient has limited motion of the left shoulder.  She is unable to reach overhead.  Pain is reproduced in all range of motion exercises.  Grips are equal bilaterally   Diagnosis is acute torticollis with left shoulder pain.  Prescription for Sterapred and baclofen was provided.  She is to apply wet heat followed by ice.  She was given a hydrocodone while in the emergency department.  She is given a work note until Monday.  Patient states she understands will comply with her treatment plan.  She was discharged in  stable condition  As part of my medical decision making, I reviewed the following data within the electronic MEDICAL RECORD NUMBER Nursing notes reviewed and incorporated, Old chart reviewed, Notes from prior ED visits and [place] Controlled Substance Database  ____________________________________________   FINAL CLINICAL IMPRESSION(S) / ED DIAGNOSES  Final diagnoses:  Muscle spasms of neck  Torticollis, acute      NEW MEDICATIONS STARTED DURING THIS VISIT:  New Prescriptions   BACLOFEN (LIORESAL) 10 MG TABLET    Take 1 tablet (10 mg total) by mouth daily.   PREDNISONE (STERAPRED UNI-PAK 21 TAB) 10 MG (21) TBPK TABLET    Take 6 pills on day one then decrease by 1 pill each day     Note:  This document was prepared  using Conservation officer, historic buildingsDragon voice recognition software and may include unintentional dictation errors.    Faythe GheeFisher, Susan W, PA-C 04/03/17 1213    Jene EveryKinner, Robert, MD 04/03/17 1440

## 2017-04-03 NOTE — Discharge Instructions (Signed)
Follow-up with your regular doctor if you are not better in 5-7 days. Use ice and wet heat to the left shoulder.  Wear the sling for 3 days

## 2017-05-11 ENCOUNTER — Emergency Department: Payer: Medicaid Other

## 2017-05-11 ENCOUNTER — Emergency Department
Admission: EM | Admit: 2017-05-11 | Discharge: 2017-05-11 | Disposition: A | Discharge status: Home / Self Care | Payer: Medicaid Other | Attending: Emergency Medicine | Admitting: Emergency Medicine

## 2017-05-11 ENCOUNTER — Other Ambulatory Visit: Payer: Self-pay

## 2017-05-11 DIAGNOSIS — J101 Influenza due to other identified influenza virus with other respiratory manifestations: Secondary | ICD-10-CM | POA: Insufficient documentation

## 2017-05-11 DIAGNOSIS — Z79899 Other long term (current) drug therapy: Secondary | ICD-10-CM | POA: Diagnosis not present

## 2017-05-11 DIAGNOSIS — R05 Cough: Secondary | ICD-10-CM | POA: Diagnosis present

## 2017-05-11 DIAGNOSIS — E119 Type 2 diabetes mellitus without complications: Secondary | ICD-10-CM | POA: Insufficient documentation

## 2017-05-11 DIAGNOSIS — F17210 Nicotine dependence, cigarettes, uncomplicated: Secondary | ICD-10-CM | POA: Insufficient documentation

## 2017-05-11 LAB — CBC
HEMATOCRIT: 28.2 % — AB (ref 35.0–47.0)
Hemoglobin: 9.3 g/dL — ABNORMAL LOW (ref 12.0–16.0)
MCH: 21.9 pg — ABNORMAL LOW (ref 26.0–34.0)
MCHC: 33 g/dL (ref 32.0–36.0)
MCV: 66.3 fL — AB (ref 80.0–100.0)
Platelets: 292 10*3/uL (ref 150–440)
RBC: 4.25 MIL/uL (ref 3.80–5.20)
RDW: 18.7 % — AB (ref 11.5–14.5)
WBC: 4.7 10*3/uL (ref 3.6–11.0)

## 2017-05-11 LAB — COMPREHENSIVE METABOLIC PANEL
ALT: 18 U/L (ref 14–54)
AST: 20 U/L (ref 15–41)
Albumin: 4.4 g/dL (ref 3.5–5.0)
Alkaline Phosphatase: 34 U/L — ABNORMAL LOW (ref 38–126)
Anion gap: 9 (ref 5–15)
BILIRUBIN TOTAL: 0.5 mg/dL (ref 0.3–1.2)
BUN: 11 mg/dL (ref 6–20)
CO2: 24 mmol/L (ref 22–32)
Calcium: 9.1 mg/dL (ref 8.9–10.3)
Chloride: 103 mmol/L (ref 101–111)
Creatinine, Ser: 1.01 mg/dL — ABNORMAL HIGH (ref 0.44–1.00)
GFR calc Af Amer: >60 mL/min (ref >60)
Glucose, Bld: 95 mg/dL (ref 65–99)
Potassium: 3.9 mmol/L (ref 3.5–5.1)
Sodium: 136 mmol/L (ref 135–145)
TOTAL PROTEIN: 7.8 g/dL (ref 6.5–8.1)

## 2017-05-11 LAB — URINALYSIS, COMPLETE (UACMP) WITH MICROSCOPIC
BACTERIA UA: NONE SEEN
BILIRUBIN URINE: NEGATIVE
Glucose, UA: NEGATIVE mg/dL
Hgb urine dipstick: NEGATIVE
Ketones, ur: NEGATIVE mg/dL
LEUKOCYTES UA: NEGATIVE
NITRITE: NEGATIVE
Protein, ur: NEGATIVE mg/dL
SPECIFIC GRAVITY, URINE: 1.019 (ref 1.005–1.030)
pH: 5 (ref 5.0–8.0)

## 2017-05-11 LAB — INFLUENZA PANEL BY PCR (TYPE A & B)
INFLAPCR: POSITIVE — AB
Influenza B By PCR: NEGATIVE

## 2017-05-11 LAB — POCT PREGNANCY, URINE: PREG TEST UR: NEGATIVE

## 2017-05-11 LAB — LIPASE, BLOOD: Lipase: 31 U/L (ref 11–51)

## 2017-05-11 MED ORDER — IBUPROFEN 600 MG PO TABS
600.0000 mg | ORAL_TABLET | Freq: Once | ORAL | Status: AC
  Administered 2017-05-11: 600 mg via ORAL
  Filled 2017-05-11: qty 1

## 2017-05-11 MED ORDER — HYDROCODONE-ACETAMINOPHEN 5-325 MG PO TABS
1.0000 | ORAL_TABLET | Freq: Once | ORAL | Status: AC
  Administered 2017-05-11: 1 via ORAL
  Filled 2017-05-11: qty 1

## 2017-05-11 MED ORDER — IPRATROPIUM-ALBUTEROL 0.5-2.5 (3) MG/3ML IN SOLN
3.0000 mL | Freq: Once | RESPIRATORY_TRACT | Status: AC
  Administered 2017-05-11: 3 mL via RESPIRATORY_TRACT
  Filled 2017-05-11: qty 3

## 2017-05-11 MED ORDER — ONDANSETRON HCL 4 MG/2ML IJ SOLN
4.0000 mg | Freq: Once | INTRAMUSCULAR | Status: AC
  Administered 2017-05-11: 4 mg via INTRAVENOUS
  Filled 2017-05-11: qty 2

## 2017-05-11 MED ORDER — SODIUM CHLORIDE 0.9 % IV BOLUS (SEPSIS)
1000.0000 mL | Freq: Once | INTRAVENOUS | Status: AC
  Administered 2017-05-11: 1000 mL via INTRAVENOUS

## 2017-05-11 MED ORDER — MORPHINE SULFATE (PF) 2 MG/ML IV SOLN
2.0000 mg | Freq: Once | INTRAVENOUS | Status: AC
  Administered 2017-05-11: 2 mg via INTRAVENOUS
  Filled 2017-05-11: qty 1

## 2017-05-11 NOTE — ED Provider Notes (Signed)
[facility] Emergency Department Provider Note ____________________________________________   I have reviewed the triage vital signs and the triage nursing note.  HISTORY  Chief Complaint Emesis and Fever   Historian Patient  HPI Rachel Leblanc is a 37 y.o. female presents from home, worked third shift at an assisted living, states that she has had cough and fever for the past 24 hours.  She is also had some nausea vomiting and diarrhea.  She has body aches all over which are moderate to severe.  She is having some mild wheezing although she does not have a history of underlying asthma.  She has not taken any antipyretic.  Denies specific abdominal pain.  Denies dysuria.     Past Medical History:  Diagnosis Date  . Anemia   . Anxiety   . Bipolar disorder (HCC)   . Diabetes mellitus without complication (HCC)   . GERD (gastroesophageal reflux disease)   . Migraines   . Sleep apnea     There are no active problems to display for this patient.   Past Surgical History:  Procedure Laterality Date  . ABCESS DRAINAGE Right 08/23/15   Axilla- Dr. Everlene Farrier  . CHOLECYSTECTOMY  2013   Laparoscopic  . NEPHRECTOMY Left 2006  . TUBAL LIGATION  2012    Prior to Admission medications   Medication Sig Start Date End Date Taking? Authorizing Provider  acetaminophen (TYLENOL) 325 MG tablet Take 650 mg by mouth every 6 (six) hours as needed.   Yes [provider]  gabapentin (NEURONTIN) 400 MG capsule Take 400 mg by mouth 3 (three) times daily. 04/23/17  Yes [provider]  loratadine (CLARITIN) 10 MG tablet Take 10 mg by mouth daily.   Yes [provider]  meloxicam (MOBIC) 7.5 MG tablet Take 7.5 mg by mouth 2 (two) times daily. 04/14/17  Yes [provider]  omeprazole (PRILOSEC) 40 MG capsule Take 40 mg by mouth daily. 04/17/17  Yes [provider]  promethazine (PHENERGAN) 25 MG tablet Take 25 mg by mouth 3  (three) times daily as needed. 04/25/17  Yes [provider]  tiZANidine (ZANAFLEX) 4 MG tablet Take 4 mg by mouth 3 (three) times daily as needed. 04/14/17  Yes [provider]  traZODone (DESYREL) 150 MG tablet Take 150 mg by mouth at bedtime. 04/14/17  Yes [provider]  baclofen (LIORESAL) 10 MG tablet Take 1 tablet (10 mg total) by mouth daily. Patient not taking: Reported on 05/11/2017 04/03/17 04/03/18  Sherrie Mustache Roselyn Bering, PA-C  predniSONE (STERAPRED UNI-PAK 21 TAB) 10 MG (21) TBPK tablet Take 6 pills on day one then decrease by 1 pill each day Patient not taking: Reported on 05/11/2017 04/03/17   Faythe Ghee, PA-C    No Known Allergies  Family History  Problem Relation Age of Onset  . Mental illness Mother     Social History Social History   Tobacco Use  . Smoking status: Current Every Day Smoker    Packs/day: 0.50    Types: Cigarettes  . Smokeless tobacco: Never Used  Substance Use Topics  . Alcohol use: No    Comment: Heavy in past- Last Use 2014  . Drug use: Yes    Types: Marijuana    Review of Systems  Constitutional: Positive for fever. Eyes: Negative for visual changes. ENT: Negative for sore throat. Cardiovascular: Negative for chest pain. Respiratory: Negative for shortness of breath.  Positive for cough.  Nonproductive. Gastrointestinal: Positive as per HPI  for nonbloody vomiting and diarrhea. Genitourinary: Negative for dysuria. Musculoskeletal: Negative for back pain. Skin: Negative for rash. Neurological: Negative for headache.  ____________________________________________   PHYSICAL EXAM:  VITAL SIGNS: ED Triage Vitals [05/11/17 0731]  Enc Vitals Group     BP 138/80     Pulse Rate (!) 107     Resp 18     Temp 100.2 F (37.9 C)     Temp Source Oral     SpO2 100 %     Weight 172 lb (78 kg)     Height 5\' 6"  (1.676 m)     Head Circumference      Peak Flow      Pain Score 9     Pain Loc      Pain Edu?      Excl.  in GC?      Constitutional: Alert and oriented. Well appearing and in no distress.  Spastic cough. HEENT   Head: Normocephalic and atraumatic.      Eyes: Conjunctivae are normal. Pupils equal and round.       Ears:         Nose: No congestion/rhinnorhea.   Mouth/Throat: Mucous membranes are moist.   Neck: No stridor. Cardiovascular/Chest: Normal rate, regular rhythm.  No murmurs, rubs, or gallops. Respiratory: Normal respiratory effort without tachypnea nor retractions.  Tight breath sounds, bronchial spastic cough.  Mild rhonchi at the bases. Gastrointestinal: Soft. No distention, no guarding, no rebound. Nontender.    Genitourinary/rectal:Deferred Musculoskeletal: Nontender with normal range of motion in all extremities. No joint effusions.  No lower extremity tenderness.  No edema. Neurologic:  Normal speech and language. No gross or focal neurologic deficits are appreciated. Skin:  Skin is warm, dry and intact. No rash noted. Psychiatric: Mood and affect are normal. Speech and behavior are normal. Patient exhibits appropriate insight and judgment.   ____________________________________________  LABS (pertinent positives/negatives) I, Governor Rooksebecca Railee Bonillas, MD the attending physician have reviewed the labs noted below.  Labs Reviewed  COMPREHENSIVE METABOLIC PANEL - Abnormal; Notable for the following components:      Result Value   Creatinine, Ser 1.01 (*)    Alkaline Phosphatase 34 (*)    All other components within normal limits  CBC - Abnormal; Notable for the following components:   Hemoglobin 9.3 (*)    HCT 28.2 (*)    MCV 66.3 (*)    MCH 21.9 (*)    RDW 18.7 (*)    All other components within normal limits  URINALYSIS, COMPLETE (UACMP) WITH MICROSCOPIC - Abnormal; Notable for the following components:   Color, Urine YELLOW (*)    APPearance CLEAR (*)    Squamous Epithelial / LPF 0-5 (*)    All other components within normal limits  INFLUENZA PANEL BY PCR (TYPE A  & B) - Abnormal; Notable for the following components:   Influenza A By PCR POSITIVE (*)    All other components within normal limits  LIPASE, BLOOD  POC URINE PREG, ED  POCT PREGNANCY, URINE     ____________________________________________  RADIOLOGY   Chest x-ray two-view reviewed and interpreted by me: No pneumonia Radiologist interpretation:  IMPRESSION: No active cardiopulmonary disease. No evidence of pneumonia, pulmonary edema or pneumothorax. __________________________________________  PROCEDURES  Procedure(s) performed: None  Critical Care performed: None   ____________________________________________  ED COURSE / ASSESSMENT AND PLAN  Pertinent labs & imaging results that were available during my care of the patient were reviewed by me and considered in my medical  decision making (see chart for details).    Patient looks like she is really not feeling well, will give a liter fluid here.  Positive for influenza A.  We discussed risk and benefits of Tamiflu and is otherwise healthy patient, and elected to hold off.  I think this is reasonable.  DIFFERENTIAL DIAGNOSIS: Including but not limited to viral GI illness, pneumonia, urinary tract infection, viral URI, influenza, dehydration, electrolyte disturbance, etc.  CONSULTATIONS: None  Patient / Family / Caregiver informed of clinical course, medical decision-making process, and agree with plan.   I discussed return precautions, follow-up instructions, and discharge instructions with patient and/or family.  Discharge Instructions : You are evaluated for cough congestion and fever and found to have the flu.  We discussed risk and benefit of Tamiflu and elected to hold off.  Return to the emergency room immediately for any worsening condition including confusion altered mental status, concern for dehydration such as dry mouth or not making urine, trouble breathing, shortness of breath, dizziness or passing out,  or any other symptoms concerning to you.    ___________________________________________   FINAL CLINICAL IMPRESSION(S) / ED DIAGNOSES   Final diagnoses:  Influenza A      ___________________________________________        Note: This dictation was prepared with Dragon dictation. Any transcriptional errors that result from this process are unintentional    Governor Rooks, MD 05/11/17 1354

## 2017-05-11 NOTE — ED Notes (Signed)
Pt sitting in hall way chair awaiting someone to come pick her up, she has been informed she is unable to drive herself home due to medications she received while here, she states she has called someone to come

## 2017-05-11 NOTE — ED Triage Notes (Signed)
Pt c/o bodyaches with N/V/D and cough since last night.

## 2017-05-11 NOTE — Discharge Instructions (Signed)
You are evaluated for cough congestion and fever and found to have the flu.  We discussed risk and benefit of Tamiflu and elected to hold off.  Return to the emergency room immediately for any worsening condition including confusion altered mental status, concern for dehydration such as dry mouth or not making urine, trouble breathing, shortness of breath, dizziness or passing out, or any other symptoms concerning to you.

## 2017-05-13 ENCOUNTER — Encounter: Payer: Medicaid Other | Admitting: Obstetrics and Gynecology

## 2017-07-05 ENCOUNTER — Encounter: Payer: Self-pay | Admitting: Emergency Medicine

## 2017-07-05 ENCOUNTER — Emergency Department
Admission: EM | Admit: 2017-07-05 | Discharge: 2017-07-05 | Disposition: A | Discharge status: Home / Self Care | Payer: Medicaid Other | Attending: Student in an Organized Health Care Education/Training Program | Admitting: Student in an Organized Health Care Education/Training Program

## 2017-07-05 ENCOUNTER — Other Ambulatory Visit: Payer: Self-pay

## 2017-07-05 DIAGNOSIS — Z79899 Other long term (current) drug therapy: Secondary | ICD-10-CM | POA: Insufficient documentation

## 2017-07-05 DIAGNOSIS — J020 Streptococcal pharyngitis: Secondary | ICD-10-CM

## 2017-07-05 DIAGNOSIS — F17210 Nicotine dependence, cigarettes, uncomplicated: Secondary | ICD-10-CM | POA: Insufficient documentation

## 2017-07-05 DIAGNOSIS — I10 Essential (primary) hypertension: Secondary | ICD-10-CM | POA: Insufficient documentation

## 2017-07-05 HISTORY — DX: Essential (primary) hypertension: I10

## 2017-07-05 LAB — GROUP A STREP BY PCR: GROUP A STREP BY PCR: DETECTED — AB

## 2017-07-05 MED ORDER — AMOXICILLIN 500 MG PO CAPS: 500.0000 mg | ORAL_CAPSULE | Freq: Three times a day (TID) | ORAL | 0 refills | Status: DC

## 2017-07-05 MED ORDER — IBUPROFEN 600 MG PO TABS: 600.0000 mg | ORAL_TABLET | Freq: Three times a day (TID) | ORAL | 0 refills | Status: DC | PRN

## 2017-07-05 MED ORDER — BENZONATATE 100 MG PO CAPS
200.0000 mg | ORAL_CAPSULE | Freq: Once | ORAL | Status: AC
  Administered 2017-07-05: 200 mg via ORAL
  Filled 2017-07-05: qty 2

## 2017-07-05 MED ORDER — LIDOCAINE VISCOUS 2 % MT SOLN: 5.0000 mL | Freq: Four times a day (QID) | OROMUCOSAL | 0 refills | Status: DC | PRN

## 2017-07-05 MED ORDER — DIPHENHYDRAMINE HCL 12.5 MG/5ML PO ELIX
12.5000 mg | ORAL_SOLUTION | Freq: Once | ORAL | Status: AC
  Administered 2017-07-05: 12.5 mg via ORAL
  Filled 2017-07-05: qty 5

## 2017-07-05 MED ORDER — LIDOCAINE VISCOUS 2 % MT SOLN
15.0000 mL | Freq: Once | OROMUCOSAL | Status: AC
  Administered 2017-07-05: 15 mL via OROMUCOSAL
  Filled 2017-07-05: qty 15

## 2017-07-05 MED ORDER — KETOROLAC TROMETHAMINE 60 MG/2ML IM SOLN
60.0000 mg | Freq: Once | INTRAMUSCULAR | Status: AC
  Administered 2017-07-05: 60 mg via INTRAMUSCULAR
  Filled 2017-07-05: qty 2

## 2017-07-05 MED ORDER — TRAMADOL HCL 50 MG PO TABS: 50.0000 mg | ORAL_TABLET | Freq: Two times a day (BID) | ORAL | 0 refills | Status: DC | PRN

## 2017-07-05 NOTE — ED Triage Notes (Signed)
C/O sorethroat, cough, body aches x 1 day.  Patient states she took 2 BC's 2 hours ago.

## 2017-07-05 NOTE — ED Provider Notes (Signed)
[facility] Emergency Department Provider Note   ____________________________________________   First MD Initiated Contact with Patient 07/05/17 [phone]     (approximate)  I have reviewed the triage vital signs and the nursing notes.   HISTORY  Chief Complaint Sore Throat and Cough    HPI Rachel Leblanc is a 37 y.o. female patient presents with sore throat, cough, body aches for 1 day.  Patient states took 2 BC 2 hours prior to arrival with no relief.  Patient also complained of nasal congestion and frontal headache.  Patient rates the pain as a 10/10.  Patient described the pain is generalized "aching".  Patient was asleep when I arrived into the exam room.  Past Medical History:  Diagnosis Date  . Anemia   . Anxiety   . Bipolar disorder (HCC)   . GERD (gastroesophageal reflux disease)   . Hypertension   . Migraines   . Sleep apnea     There are no active problems to display for this patient.   Past Surgical History:  Procedure Laterality Date  . ABCESS DRAINAGE Right 08/23/15   Axilla- Dr. Everlene Farrier  . CHOLECYSTECTOMY  2013   Laparoscopic  . NEPHRECTOMY Left 2006  . TUBAL LIGATION  2012    Prior to Admission medications   Medication Sig Start Date End Date Taking? Authorizing Provider  acetaminophen (TYLENOL) 325 MG tablet Take 650 mg by mouth every 6 (six) hours as needed.    [provider]  amoxicillin (AMOXIL) 500 MG capsule Take 1 capsule (500 mg total) by mouth 3 (three) times daily. 07/05/17   Joni Reining, PA-C  baclofen (LIORESAL) 10 MG tablet Take 1 tablet (10 mg total) by mouth daily. Patient not taking: Reported on 05/11/2017 04/03/17 04/03/18  Sherrie Mustache Roselyn Bering, PA-C  gabapentin (NEURONTIN) 400 MG capsule Take 400 mg by mouth 3 (three) times daily. 04/23/17   [provider]  ibuprofen (ADVIL,MOTRIN) 600 MG tablet Take 1 tablet (600 mg total) by mouth every 8 (eight) hours as needed. 07/05/17   Joni Reining, PA-C    lidocaine (XYLOCAINE) 2 % solution Use as directed 5 mLs in the mouth or throat every 6 (six) hours as needed for mouth pain. For swish and swallow. 07/05/17   Joni Reining, PA-C  loratadine (CLARITIN) 10 MG tablet Take 10 mg by mouth daily.    [provider]  meloxicam (MOBIC) 7.5 MG tablet Take 7.5 mg by mouth 2 (two) times daily. 04/14/17   [provider]  omeprazole (PRILOSEC) 40 MG capsule Take 40 mg by mouth daily. 04/17/17   [provider]  predniSONE (STERAPRED UNI-PAK 21 TAB) 10 MG (21) TBPK tablet Take 6 pills on day one then decrease by 1 pill each day Patient not taking: Reported on 05/11/2017 04/03/17   Sherrie Mustache Roselyn Bering, PA-C  promethazine (PHENERGAN) 25 MG tablet Take 25 mg by mouth 3 (three) times daily as needed. 04/25/17   [provider]  tiZANidine (ZANAFLEX) 4 MG tablet Take 4 mg by mouth 3 (three) times daily as needed. 04/14/17   [provider]  traMADol (ULTRAM) 50 MG tablet Take 1 tablet (50 mg total) by mouth every 12 (twelve) hours as needed. 07/05/17   Joni Reining, PA-C  traZODone (DESYREL) 150 MG tablet Take 150 mg by mouth at bedtime. 04/14/17   [provider]    Allergies Patient has no known allergies.  Family History  Problem Relation Age of Onset  .  Mental illness Mother     Social History Social History   Tobacco Use  . Smoking status: Current Every Day Smoker    Packs/day: 0.50    Types: Cigarettes  . Smokeless tobacco: Never Used  Substance Use Topics  . Alcohol use: No    Comment: Heavy in past- Last Use 2014  . Drug use: Yes    Types: Marijuana    Review of Systems Constitutional: No fever/chills.  Generalized body ache. Eyes: No visual changes. ENT: Sore throat and nasal congestion Cardiovascular: Denies chest pain. Respiratory: Denies shortness of breath.  Nonproductive cough Gastrointestinal: No abdominal pain.  No nausea, no vomiting.  No diarrhea.  No  constipation. Genitourinary: Negative for dysuria. Musculoskeletal: Negative for back pain. Skin: Negative for rash. Neurological: Positive for headaches, but denies focal weakness or numbness.    ____________________________________________   PHYSICAL EXAM:  VITAL SIGNS: ED Triage Vitals  Enc Vitals Group     BP 07/05/17 0855 117/86     Pulse Rate 07/05/17 0855 79     Resp 07/05/17 0855 18     Temp 07/05/17 0855 99.3 F (37.4 C)     Temp Source 07/05/17 0855 Oral     SpO2 07/05/17 0855 95 %     Weight 07/05/17 0856 164 lb (74.4 kg)     Height 07/05/17 0856  (1.651 m)     Head Circumference --      Peak Flow --      Pain Score 07/05/17 0858 8     Pain Loc --      Pain Edu? --      Excl. in GC? --    Constitutional: Alert and oriented. Well appearing and in no acute distress. Eyes: Conjunctivae are normal. PERRL. EOMI. Head: Atraumatic. Nose: Bilateral maxillary guarding.  Edematous nasal turbinates clear rhinorrhea. Mouth/Throat: Mucous membranes are moist.  Oropharynx erythematous. Neck: No stridor.  Hematological/Lymphatic/Immunilogical: No cervical lymphadenopathy. Cardiovascular: Normal rate, regular rhythm. Grossly normal heart sounds.  Good peripheral circulation. Respiratory: Normal respiratory effort.  No retractions. Lungs CTAB. Gastrointestinal: Soft and nontender. No distention. No abdominal bruits. No CVA tenderness. Musculoskeletal: No lower extremity tenderness nor edema.  No joint effusions. Neurologic:  Normal speech and language. No gross focal neurologic deficits are appreciated. No gait instability. Skin:  Skin is warm, dry and intact. No rash noted. Psychiatric: Mood and affect are normal. Speech and behavior are normal.  ____________________________________________   LABS (all labs ordered are listed, but only abnormal results are displayed)  Labs Reviewed  GROUP A STREP BY PCR - Abnormal; Notable for the following components:      Result  Value   Group A Strep by PCR DETECTED (*)    All other components within normal limits   ____________________________________________  EKG   ____________________________________________  RADIOLOGY  ED MD interpretation:    Official radiology report(s): No results found.  ____________________________________________   PROCEDURES  Procedure(s) performed: None  Procedures  Critical Care performed: No  ____________________________________________   INITIAL IMPRESSION / ASSESSMENT AND PLAN / ED COURSE  As part of my medical decision making, I reviewed the following data within the electronic MEDICAL RECORD NUMBER    Sore throat secondary to strep pharyngitis.  Patient given discharge care instruction advised take medication as directed.  Patient advised to follow-up PCP for continued care.      ____________________________________________   FINAL CLINICAL IMPRESSION(S) / ED DIAGNOSES  Final diagnoses:  Strep pharyngitis     ED Discharge  Orders        Ordered    amoxicillin (AMOXIL) 500 MG capsule  3 times daily     07/05/17 1141    traMADol (ULTRAM) 50 MG tablet  Every 12 hours PRN     07/05/17 1141    ibuprofen (ADVIL,MOTRIN) 600 MG tablet  Every 8 hours PRN     07/05/17 1141    lidocaine (XYLOCAINE) 2 % solution  Every 6 hours PRN     07/05/17 1141       Note:  This document was prepared using Dragon voice recognition software and may include unintentional dictation errors.    Joni Reining, PA-C 07/15/17 1137    Willy Eddy, MD 07/16/17 [phone]

## 2017-07-05 NOTE — ED Provider Notes (Signed)
[facility] Emergency Department Provider Note   ____________________________________________   First MD Initiated Contact with Patient 07/05/17 [phone]     (approximate)  I have reviewed the triage vital signs and the nursing notes.   HISTORY  Chief Complaint Sore Throat and Cough   HPI Rachel Leblanc is a 37 y.o. female   Patient complain of cough, sore throat, and body aches for 1 day.  Patient no relief taken over-the-counter anti-inflammatory medications.  Patient denies fever, chills, nausea, no vomiting.  Patient rates the pain as a 10/10.  Patient described pain is "achy".  No other palliative measures for complaint. Past Medical History:  Diagnosis Date  . Anemia   . Anxiety   . Bipolar disorder (HCC)   . GERD (gastroesophageal reflux disease)   . Hypertension   . Migraines   . Sleep apnea     There are no active problems to display for this patient.   Past Surgical History:  Procedure Laterality Date  . ABCESS DRAINAGE Right 08/23/15   Axilla- Dr. Everlene Farrier  . CHOLECYSTECTOMY  2013   Laparoscopic  . NEPHRECTOMY Left 2006  . TUBAL LIGATION  2012    Prior to Admission medications   Medication Sig Start Date End Date Taking? Authorizing Provider  acetaminophen (TYLENOL) 325 MG tablet Take 650 mg by mouth every 6 (six) hours as needed.    [provider]  amoxicillin (AMOXIL) 500 MG capsule Take 1 capsule (500 mg total) by mouth 3 (three) times daily. 07/05/17   Joni Reining, PA-C  baclofen (LIORESAL) 10 MG tablet Take 1 tablet (10 mg total) by mouth daily. Patient not taking: Reported on 05/11/2017 04/03/17 04/03/18  Sherrie Mustache Roselyn Bering, PA-C  gabapentin (NEURONTIN) 400 MG capsule Take 400 mg by mouth 3 (three) times daily. 04/23/17   [provider]  ibuprofen (ADVIL,MOTRIN) 600 MG tablet Take 1 tablet (600 mg total) by mouth every 8 (eight) hours as needed. 07/05/17   Joni Reining, PA-C  lidocaine (XYLOCAINE) 2 % solution  Use as directed 5 mLs in the mouth or throat every 6 (six) hours as needed for mouth pain. For swish and swallow. 07/05/17   Joni Reining, PA-C  loratadine (CLARITIN) 10 MG tablet Take 10 mg by mouth daily.    [provider]  meloxicam (MOBIC) 7.5 MG tablet Take 7.5 mg by mouth 2 (two) times daily. 04/14/17   [provider]  omeprazole (PRILOSEC) 40 MG capsule Take 40 mg by mouth daily. 04/17/17   [provider]  predniSONE (STERAPRED UNI-PAK 21 TAB) 10 MG (21) TBPK tablet Take 6 pills on day one then decrease by 1 pill each day Patient not taking: Reported on 05/11/2017 04/03/17   Sherrie Mustache Roselyn Bering, PA-C  promethazine (PHENERGAN) 25 MG tablet Take 25 mg by mouth 3 (three) times daily as needed. 04/25/17   [provider]  tiZANidine (ZANAFLEX) 4 MG tablet Take 4 mg by mouth 3 (three) times daily as needed. 04/14/17   [provider]  traMADol (ULTRAM) 50 MG tablet Take 1 tablet (50 mg total) by mouth every 12 (twelve) hours as needed. 07/05/17   Joni Reining, PA-C  traZODone (DESYREL) 150 MG tablet Take 150 mg by mouth at bedtime. 04/14/17   [provider]    Allergies Patient has no known allergies.  Family History  Problem Relation Age of Onset  . Mental illness Mother     Social History Social History  Tobacco Use  . Smoking status: Current Every Day Smoker    Packs/day: 0.50    Types: Cigarettes  . Smokeless tobacco: Never Used  Substance Use Topics  . Alcohol use: No    Comment: Heavy in past- Last Use 2014  . Drug use: Yes    Types: Marijuana    Review of Systems  Constitutional: No fever/chills Eyes: No visual changes. ENT: No sore throat. Cardiovascular: Denies chest pain. Respiratory: Denies shortness of breath. Gastrointestinal: No abdominal pain.  No nausea, no vomiting.  No diarrhea.  No constipation. Genitourinary: Negative for dysuria. Musculoskeletal: Negative for back pain. Skin: Negative for  rash. Neurological: Negative for headaches, focal weakness or numbness. Psychiatric:Anxiety and bipolar. Endocrine:Hypertension ____________________________________________   PHYSICAL EXAM:  VITAL SIGNS: ED Triage Vitals  Enc Vitals Group     BP 07/05/17 0855 117/86     Pulse Rate 07/05/17 0855 79     Resp 07/05/17 0855 18     Temp 07/05/17 0855 99.3 F (37.4 C)     Temp Source 07/05/17 0855 Oral     SpO2 07/05/17 0855 95 %     Weight 07/05/17 0856 164 lb (74.4 kg)     Height 07/05/17 0856  (1.651 m)     Head Circumference --      Peak Flow --      Pain Score 07/05/17 0858 8     Pain Loc --      Pain Edu? --      Excl. in GC? --    Constitutional: Alert and oriented. Well appearing and in no acute distress. Eyes: Conjunctivae are normal. PERRL. EOMI. Head: Atraumatic. Nose: No congestion/rhinnorhea. Mouth/Throat: Mucous membranes are moist.  Oropharynx edematous with exudate.   Neck: No stridor.  Hematological/Lymphatic/Immunilogical: Bilateral cervical lymphadenopathy. Cardiovascular: Normal rate, regular rhythm. Grossly normal heart sounds.  Good peripheral circulation. Respiratory: Normal respiratory effort.  No retractions. Lungs CTAB. Skin:  Skin is warm, dry and intact. No rash noted. Psychiatric: Mood and affect are normal. Speech and behavior are normal.  ____________________________________________   LABS (all labs ordered are listed, but only abnormal results are displayed)  Labs Reviewed  GROUP A STREP BY PCR - Abnormal; Notable for the following components:      Result Value   Group A Strep by PCR DETECTED (*)    All other components within normal limits   ____________________________________________  EKG   ____________________________________________  RADIOLOGY    Official radiology report(s): No results found.  ____________________________________________   PROCEDURES  Procedure(s) performed: None  Procedures  Critical Care  performed: No  ____________________________________________   INITIAL IMPRESSION / ASSESSMENT AND PLAN / ED COURSE  As part of my medical decision making, I reviewed the following data within the electronic MEDICAL RECORD NUMBER    Sore throat secondary to strep pharyngitis.  Patient given discharge care instruction advised take medication as directed.  Patient given a work note and advised [facility]      ____________________________________________   FINAL CLINICAL IMPRESSION(S) / ED DIAGNOSES  Final diagnoses:  Strep pharyngitis     ED Discharge Orders        Ordered    amoxicillin (AMOXIL) 500 MG capsule  3 times daily     07/05/17 1141    traMADol (ULTRAM) 50 MG tablet  Every 12 hours PRN     07/05/17 1141    ibuprofen (ADVIL,MOTRIN) 600 MG tablet  Every 8 hours PRN  07/05/17 1141    lidocaine (XYLOCAINE) 2 % solution  Every 6 hours PRN     07/05/17 1141       Note:  This document was prepared using Dragon voice recognition software and may include unintentional dictation errors.    Joni Reining, PA-C 07/05/17 1151    Willy Eddy, MD 07/05/17 [phone]

## 2017-07-05 NOTE — ED Notes (Signed)
While ambulating to rm pt stated "I forgot to tell them out front that my stomach hurts and so does my back." Pt  holding lower abdomen while she was telling me this. Rhonda,PA and Lorrie,RN made aware.

## 2017-07-05 NOTE — ED Notes (Signed)
Pt complains of stomach and back "pressure". Headache, cough, congestion x 1 week. Uvula appears to be large, unable to assess throat due to size.

## 2018-01-25 ENCOUNTER — Emergency Department: Payer: Self-pay

## 2018-01-25 ENCOUNTER — Encounter: Payer: Self-pay | Admitting: Emergency Medicine

## 2018-01-25 ENCOUNTER — Emergency Department
Admission: EM | Admit: 2018-01-25 | Discharge: 2018-01-25 | Disposition: A | Discharge status: Home / Self Care | Payer: Self-pay | Attending: Emergency Medicine | Admitting: Emergency Medicine

## 2018-01-25 DIAGNOSIS — Y999 Unspecified external cause status: Secondary | ICD-10-CM | POA: Insufficient documentation

## 2018-01-25 DIAGNOSIS — I10 Essential (primary) hypertension: Secondary | ICD-10-CM | POA: Insufficient documentation

## 2018-01-25 DIAGNOSIS — Y92090 Kitchen in other non-institutional residence as the place of occurrence of the external cause: Secondary | ICD-10-CM | POA: Insufficient documentation

## 2018-01-25 DIAGNOSIS — Z79899 Other long term (current) drug therapy: Secondary | ICD-10-CM | POA: Insufficient documentation

## 2018-01-25 DIAGNOSIS — W01198A Fall on same level from slipping, tripping and stumbling with subsequent striking against other object, initial encounter: Secondary | ICD-10-CM | POA: Insufficient documentation

## 2018-01-25 DIAGNOSIS — F17210 Nicotine dependence, cigarettes, uncomplicated: Secondary | ICD-10-CM | POA: Insufficient documentation

## 2018-01-25 DIAGNOSIS — Y9389 Activity, other specified: Secondary | ICD-10-CM | POA: Insufficient documentation

## 2018-01-25 DIAGNOSIS — S43122A Dislocation of left acromioclavicular joint, 100%-200% displacement, initial encounter: Secondary | ICD-10-CM | POA: Insufficient documentation

## 2018-01-25 DIAGNOSIS — S43085A Other dislocation of left shoulder joint, initial encounter: Secondary | ICD-10-CM

## 2018-01-25 MED ORDER — OXYCODONE-ACETAMINOPHEN 5-325 MG PO TABS: 1.0000 | ORAL_TABLET | ORAL | 0 refills | Status: DC | PRN

## 2018-01-25 MED ORDER — MELOXICAM 15 MG PO TABS: 15.0000 mg | ORAL_TABLET | Freq: Every day | ORAL | 2 refills | Status: AC

## 2018-01-25 MED ORDER — HYDROMORPHONE HCL 1 MG/ML IJ SOLN
1.0000 mg | Freq: Once | INTRAMUSCULAR | Status: AC
  Administered 2018-01-25: 1 mg via INTRAMUSCULAR
  Filled 2018-01-25: qty 1

## 2018-01-25 MED ORDER — MELOXICAM 15 MG PO TABS: 15.0000 mg | ORAL_TABLET | Freq: Every day | ORAL | 2 refills | Status: DC

## 2018-01-25 NOTE — ED Triage Notes (Signed)
Pt reports slipped and fell this am into a wall and now her shoulder hurt severely and she can not lift it. Pt tearful in triage.

## 2018-01-25 NOTE — Discharge Instructions (Addendum)
Follow-up with Dr. Martha ClanKrasinski this week.  Please call his office for an appointment.  Wear the sling all the time unless you are in the shower.  Apply ice to the left shoulder as much as possible.  You have been given an anti-inflammatory and a narcotic.  Take the anti-inflammatory once a day.  Narcotic for pain that is not controlled by anti-inflammatories.  Return to emergency department if worsening.

## 2018-01-25 NOTE — ED Notes (Signed)
See triage note  States she fell into wall  States she hit her left shoulder  Having pain with some swelling to posterior shoulder and clavicle area  Unable to move shoulder d/t pain

## 2018-01-25 NOTE — ED Provider Notes (Signed)
[facility] Emergency Department Provider Note  ____________________________________________   First MD Initiated Contact with Patient 01/25/18 1043     (approximate)  I have reviewed the triage vital signs and the nursing notes.   HISTORY  Chief Complaint Shoulder Pain    HPI Rachel Leblanc is a 37 y.o. female presents emergency department complaining of left shoulder pain.  States she slipped and fell in her friend's kitchen and hit her left shoulder against the corner of the wall.  She states she felt like it pushed her entire shoulder forward.  She states she feels a lot of pain right at the top of the shoulder.  She is unable to move her arm due to pain.  She denies any neck or elbow injury.    Past Medical History:  Diagnosis Date  . Anemia   . Anxiety   . Bipolar disorder (HCC)   . GERD (gastroesophageal reflux disease)   . Hypertension   . Migraines   . Sleep apnea     There are no active problems to display for this patient.   Past Surgical History:  Procedure Laterality Date  . ABCESS DRAINAGE Right 08/23/15   Axilla- Dr. Everlene Farrier  . CHOLECYSTECTOMY  2013   Laparoscopic  . NEPHRECTOMY Left 2006  . TUBAL LIGATION  2012    Prior to Admission medications   Medication Sig Start Date End Date Taking? Authorizing Provider  acetaminophen (TYLENOL) 325 MG tablet Take 650 mg by mouth every 6 (six) hours as needed.    [provider]  gabapentin (NEURONTIN) 400 MG capsule Take 400 mg by mouth 3 (three) times daily. 04/23/17   [provider]  ibuprofen (ADVIL,MOTRIN) 600 MG tablet Take 1 tablet (600 mg total) by mouth every 8 (eight) hours as needed. 07/05/17   Joni Reining, PA-C  loratadine (CLARITIN) 10 MG tablet Take 10 mg by mouth daily.    [provider]  meloxicam (MOBIC) 15 MG tablet Take 1 tablet (15 mg total) by mouth daily. 01/25/18 04/25/18  Sherrie Mustache Roselyn Bering, PA-C  omeprazole (PRILOSEC) 40 MG capsule Take  40 mg by mouth daily. 04/17/17   [provider]  oxyCODONE-acetaminophen (PERCOCET/ROXICET) 5-325 MG tablet Take 1 tablet by mouth every 4 (four) hours as needed for severe pain. 01/25/18   Marisol Giambra, Roselyn Bering, PA-C  promethazine (PHENERGAN) 25 MG tablet Take 25 mg by mouth 3 (three) times daily as needed. 04/25/17   [provider]  tiZANidine (ZANAFLEX) 4 MG tablet Take 4 mg by mouth 3 (three) times daily as needed. 04/14/17   [provider]  traZODone (DESYREL) 150 MG tablet Take 150 mg by mouth at bedtime. 04/14/17   [provider]    Allergies Patient has no known allergies.  Family History  Problem Relation Age of Onset  . Mental illness Mother     Social History Social History   Tobacco Use  . Smoking status: Current Every Day Smoker    Packs/day: 0.50    Types: Cigarettes  . Smokeless tobacco: Never Used  Substance Use Topics  . Alcohol use: No    Comment: Heavy in past- Last Use 2014  . Drug use: Yes    Types: Marijuana    Review of Systems  Constitutional: No fever/chills Eyes: No visual changes. ENT: No sore throat. Respiratory: Denies cough Genitourinary: Negative for dysuria. Musculoskeletal: Negative for back pain.  Positive for left shoulder pain. Skin: Negative for rash.  ____________________________________________   PHYSICAL EXAM:  VITAL SIGNS: ED Triage Vitals  Enc Vitals Group     BP 01/25/18 1036 134/86     Pulse Rate 01/25/18 1036 (!) 102     Resp 01/25/18 1036 18     Temp 01/25/18 1036 98.1 F (36.7 C)     Temp Source 01/25/18 1036 Oral     SpO2 01/25/18 1036 98 %     Weight 01/25/18 1033 138 lb (62.6 kg)     Height 01/25/18 1033 5\' 6"  (1.676 m)     Head Circumference --      Peak Flow --      Pain Score 01/25/18 1033 10     Pain Loc --      Pain Edu? --      Excl. in GC? --     Constitutional: Alert and oriented. Well appearing and in no acute distress. Eyes: Conjunctivae are normal.  Head:  Atraumatic. Nose: No congestion/rhinnorhea. Mouth/Throat: Mucous membranes are moist.   Neck:  supple no lymphadenopathy noted Cardiovascular: Normal rate, regular rhythm. Heart sounds are normal Respiratory: Normal respiratory effort.  No retractions, lungs c t a  GU: deferred Musculoskeletal:left shoulder has decreased rom secondary to discomfort, tender at acj, elbow is not tender, n/v intact Neurologic:  Normal speech and language.  Skin:  Skin is warm, dry and intact. No rash noted. Psychiatric: Mood and affect are normal. Speech and behavior are normal.  ____________________________________________   LABS (all labs ordered are listed, but only abnormal results are displayed)  Labs Reviewed - No data to display ____________________________________________   ____________________________________________  RADIOLOGY  X-ray of the left shoulder shows a grade 3 AC separation  ____________________________________________   PROCEDURES  Procedure(s) performed: Dilaudid 1 mg IM, sling applied to the left shoulder by the tech  Procedures    ____________________________________________   INITIAL IMPRESSION / ASSESSMENT AND PLAN / ED COURSE  Pertinent labs & imaging results that were available during my care of the patient were reviewed by me and considered in my medical decision making (see chart for details).   Patient is [age] year old female presents emergency department with a left shoulder injury.  Physical exam shows tenderness along the ACJ.  Patient is crying in pain.  X-ray of the left shoulder shows a grade 3 AC separation.  Explained the results to the patient.  She was placed in a sling given Dilaudid 1 mg IM.  Page Dr. Martha ClanKrasinski for follow-up.  He states for the patient to be placed in sling and follow-up in his office in 1 week.  Patient was given a prescription for meloxicam and Percocet.  She was instructed to use the meloxicam first and if it controls the  pain to not use the Percocet.  She states she understands will comply.  She is to continue to apply ice.  Follow-up Dr. Martha ClanKrasinski this week.  Phone number was Provided for her.  She was discharged in stable condition in the care of her family member.     As part of my medical decision making, I reviewed the following data within the electronic MEDICAL RECORD NUMBER History obtained from family, Nursing notes reviewed and incorporated, Old chart reviewed, Radiograph reviewed x-ray of the left shoulder shows a grade 3 separation, A consult was requested and obtained from this/these consultant(s) Orthopedics, Dr. Martha ClanKrasinski, Notes from prior ED visits and [place] Controlled Substance Database  ____________________________________________   FINAL CLINICAL IMPRESSION(S) / ED DIAGNOSES  Final diagnoses:  Grade 3  separation of left shoulder, initial encounter      NEW MEDICATIONS STARTED DURING THIS VISIT:  Current Discharge Medication List    START taking these medications   Details  oxyCODONE-acetaminophen (PERCOCET/ROXICET) 5-325 MG tablet Take 1 tablet by mouth every 4 (four) hours as needed for severe pain. Qty: 30 tablet, Refills: 0         Note:  This document was prepared using Dragon voice recognition software and may include unintentional dictation errors.    Faythe Ghee, PA-C 01/25/18 1217    Governor Rooks, MD 01/25/18 [phone]

## 2018-09-20 ENCOUNTER — Encounter: Payer: Self-pay | Admitting: Emergency Medicine

## 2018-09-20 ENCOUNTER — Other Ambulatory Visit: Payer: Self-pay

## 2018-09-20 ENCOUNTER — Emergency Department: Payer: Self-pay

## 2018-09-20 ENCOUNTER — Emergency Department
Admission: EM | Admit: 2018-09-20 | Discharge: 2018-09-21 | Disposition: A | Discharge status: Home / Self Care | Payer: Self-pay | Attending: Emergency Medicine | Admitting: Emergency Medicine

## 2018-09-20 DIAGNOSIS — F17210 Nicotine dependence, cigarettes, uncomplicated: Secondary | ICD-10-CM | POA: Insufficient documentation

## 2018-09-20 DIAGNOSIS — Z79899 Other long term (current) drug therapy: Secondary | ICD-10-CM | POA: Insufficient documentation

## 2018-09-20 DIAGNOSIS — L02411 Cutaneous abscess of right axilla: Secondary | ICD-10-CM | POA: Insufficient documentation

## 2018-09-20 DIAGNOSIS — I10 Essential (primary) hypertension: Secondary | ICD-10-CM | POA: Insufficient documentation

## 2018-09-20 LAB — CBC
HCT: 28.2 % — ABNORMAL LOW (ref 36.0–46.0)
Hemoglobin: 8.3 g/dL — ABNORMAL LOW (ref 12.0–15.0)
MCH: 18.4 pg — ABNORMAL LOW (ref 26.0–34.0)
MCHC: 29.4 g/dL — ABNORMAL LOW (ref 30.0–36.0)
MCV: 62.7 fL — ABNORMAL LOW (ref 80.0–100.0)
Platelets: 315 10*3/uL (ref 150–400)
RBC: 4.5 MIL/uL (ref 3.87–5.11)
RDW: 20.2 % — ABNORMAL HIGH (ref 11.5–15.5)
WBC: 5.6 10*3/uL (ref 4.0–10.5)
nRBC: 0 % (ref 0.0–0.2)

## 2018-09-20 LAB — BASIC METABOLIC PANEL
Anion gap: 10 (ref 5–15)
BUN: 12 mg/dL (ref 6–20)
CO2: 26 mmol/L (ref 22–32)
Calcium: 8.9 mg/dL (ref 8.9–10.3)
Chloride: 101 mmol/L (ref 98–111)
Creatinine, Ser: 1 mg/dL (ref 0.44–1.00)
GFR calc Af Amer: >60 mL/min (ref >60)
GFR calc non Af Amer: >60 mL/min (ref >60)
Glucose, Bld: 121 mg/dL — ABNORMAL HIGH (ref 70–99)
Potassium: 4 mmol/L (ref 3.5–5.1)
Sodium: 137 mmol/L (ref 135–145)

## 2018-09-20 LAB — TROPONIN I (HIGH SENSITIVITY)
Troponin I (High Sensitivity): 4 ng/L (ref <18)
Troponin I (High Sensitivity): 3 ng/L (ref <18)

## 2018-09-20 LAB — ETHANOL: Alcohol, Ethyl (B): <10 mg/dL (ref <10)

## 2018-09-20 MED ORDER — LIDOCAINE HCL (PF) 1 % IJ SOLN
30.0000 mL | Freq: Once | INTRAMUSCULAR | Status: AC
  Administered 2018-09-20: 30 mL
  Filled 2018-09-20: qty 30

## 2018-09-20 MED ORDER — LIDOCAINE HCL (PF) 1 % IJ SOLN
INTRAMUSCULAR | Status: AC
  Filled 2018-09-20: qty 15

## 2018-09-20 MED ORDER — SODIUM CHLORIDE 0.9 % IV SOLN
3.0000 g | Freq: Once | INTRAVENOUS | Status: AC
  Administered 2018-09-20: 3 g via INTRAVENOUS
  Filled 2018-09-20: qty 8

## 2018-09-20 MED ORDER — IPRATROPIUM-ALBUTEROL 0.5-2.5 (3) MG/3ML IN SOLN
3.0000 mL | Freq: Once | RESPIRATORY_TRACT | Status: AC
  Administered 2018-09-20: 3 mL via RESPIRATORY_TRACT
  Filled 2018-09-20: qty 3

## 2018-09-20 MED ORDER — HYDROMORPHONE HCL 1 MG/ML IJ SOLN
0.5000 mg | Freq: Once | INTRAMUSCULAR | Status: AC
  Administered 2018-09-20: 23:00:00 0.5 mg via INTRAVENOUS
  Filled 2018-09-20: qty 1

## 2018-09-20 NOTE — ED Notes (Signed)
MD in to I and d abscess

## 2018-09-20 NOTE — Progress Notes (Signed)
PHARMACY -  BRIEF ANTIBIOTIC NOTE   Pharmacy has received consult(s) for Unasyn from an ED provider.  The patient's profile has been reviewed for ht/wt/allergies/indication/available labs.    One time order(s) placed for Unasyn 3g IV x 1  Further antibiotics/pharmacy consults should be ordered by admitting physician if indicated.                       Thank you,  Tobie Lords, PharmD, BCPS Clinical Pharmacist 09/20/2018  10:49 PM

## 2018-09-20 NOTE — ED Triage Notes (Addendum)
Pt arrived via POV with reports of abscess to armpit x 1 week.  In addition to the armpit abscess, pt states she is having chest pain, and reports vomiting. Pt repeated multiple times during triage that she felt like she was going to pass out.  No distress noted in triage.

## 2018-09-20 NOTE — ED Notes (Signed)
Report from jessica, rn.  

## 2018-09-20 NOTE — ED Notes (Signed)
Report given to April, RN

## 2018-09-20 NOTE — ED Notes (Signed)
Pt lying in bed speaking with this RN in NAD, A&Ox4.  

## 2018-09-21 MED ORDER — AMOXICILLIN-POT CLAVULANATE 875-125 MG PO TABS: 1.0000 | ORAL_TABLET | Freq: Two times a day (BID) | ORAL | 0 refills | Status: AC

## 2018-09-21 MED ORDER — OXYCODONE-ACETAMINOPHEN 5-325 MG PO TABS: 1.0000 | ORAL_TABLET | ORAL | 0 refills | Status: DC | PRN

## 2018-09-21 NOTE — ED Provider Notes (Signed)
[facility] Emergency Department Provider Note   ____________________________________________   First MD Initiated Contact with Patient 09/20/18 2109     (approximate)  I have reviewed the triage vital signs and the nursing notes.   HISTORY  Chief Complaint Abscess and Chest Pain    HPI Rachel Leblanc is a 38 y.o. female who complains of an abscess in her right axilla that she has had before.  Pain from that is going down into her arm and across her chest.  She has some pain in her chest on palpation of the chest as well.  She is not running a fever.  She does not have a cough.  Pain is not worse with exertion.  She is not short of breath.         Past Medical History:  Diagnosis Date  . Anemia   . Anxiety   . Bipolar disorder (HCC)   . GERD (gastroesophageal reflux disease)   . Hypertension   . Migraines   . Sleep apnea     There are no active problems to display for this patient.   Past Surgical History:  Procedure Laterality Date  . ABCESS DRAINAGE Right 08/23/15   Axilla- Dr. Everlene FarrierPabon  . CHOLECYSTECTOMY  2013   Laparoscopic  . NEPHRECTOMY Left 2006  . TUBAL LIGATION  2012    Prior to Admission medications   Medication Sig Start Date End Date Taking? Authorizing Provider  acetaminophen (TYLENOL) 325 MG tablet Take 650 mg by mouth every 6 (six) hours as needed.    [provider]  amoxicillin-clavulanate (AUGMENTIN) 875-125 MG tablet Take 1 tablet by mouth 2 (two) times daily for 10 days. 09/21/18 10/01/18  Arnaldo NatalMalinda, Milana Salay F, MD  gabapentin (NEURONTIN) 400 MG capsule Take 400 mg by mouth 3 (three) times daily. 04/23/17   [provider]  ibuprofen (ADVIL,MOTRIN) 600 MG tablet Take 1 tablet (600 mg total) by mouth every 8 (eight) hours as needed. 07/05/17   Joni ReiningSmith, Ronald K, PA-C  loratadine (CLARITIN) 10 MG tablet Take 10 mg by mouth daily.    [provider]  omeprazole (PRILOSEC) 40 MG capsule Take 40 mg by mouth  daily. 04/17/17   [provider]  oxyCODONE-acetaminophen (PERCOCET) 5-325 MG tablet Take 1 tablet by mouth every 4 (four) hours as needed for severe pain. 09/21/18 09/21/19  Arnaldo NatalMalinda, Jamiel Goncalves F, MD  oxyCODONE-acetaminophen (PERCOCET/ROXICET) 5-325 MG tablet Take 1 tablet by mouth every 4 (four) hours as needed for severe pain. 01/25/18   Fisher, Roselyn BeringSusan W, PA-C  promethazine (PHENERGAN) 25 MG tablet Take 25 mg by mouth 3 (three) times daily as needed. 04/25/17   [provider]  tiZANidine (ZANAFLEX) 4 MG tablet Take 4 mg by mouth 3 (three) times daily as needed. 04/14/17   [provider]  traZODone (DESYREL) 150 MG tablet Take 150 mg by mouth at bedtime. 04/14/17   [provider]    Allergies Patient has no known allergies.  Family History  Problem Relation Age of Onset  . Mental illness Mother     Social History Social History   Tobacco Use  . Smoking status: Current Every Day Smoker    Packs/day: 0.50    Types: Cigarettes  . Smokeless tobacco: Never Used  Substance Use Topics  . Alcohol use: Yes    Comment: Heavy in past- Last Use 2014  . Drug use: Yes    Types: Marijuana    Review of Systems  Constitutional: No fever/chills  Eyes: No visual changes. ENT: No sore throat. Cardiovascular: Denies chest pain. Respiratory: Denies shortness of breath. Gastrointestinal: No abdominal pain.  No nausea, no vomiting.  No diarrhea.  No constipation. Genitourinary: Negative for dysuria. Musculoskeletal: Negative for back pain. Skin: Negative for rash. Neurological: Negative for headaches, focal weakness  ____________________________________________   PHYSICAL EXAM:  VITAL SIGNS: ED Triage Vitals  Enc Vitals Group     BP 09/20/18 1742 127/82     Pulse Rate 09/20/18 1742 96     Resp 09/20/18 1742 18     Temp 09/20/18 1742 98.8 F (37.1 C)     Temp Source 09/20/18 1742 Oral     SpO2 09/20/18 1742 100 %     Weight 09/20/18 1744 140 lb (63.5 kg)      Height 09/20/18 1744 5\' 6"  (1.676 m)     Head Circumference --      Peak Flow --      Pain Score 09/20/18 1747 8     Pain Loc --      Pain Edu? --      Excl. in [place]? --     Constitutional: Alert and oriented. Well appearing and in no acute distress. Eyes: Conjunctivae are normal.  Head: Atraumatic. Nose: No congestion/rhinnorhea. Mouth/Throat: Mucous membranes are moist.  Oropharynx non-erythematous. Neck: No stridor.   Cardiovascular: Normal rate, regular rhythm. Grossly normal heart sounds.  Good peripheral circulation. Respiratory: Normal respiratory effort.  No retractions. Lungs CTAB. Gastrointestinal: Soft and nontender. No distention. No abdominal bruits. No CVA tenderness. Musculoskeletal: No lower extremity tenderness nor edema.  Neurologic:  Normal speech and language. No gross focal neurologic deficits are appreciated. No gait instability. Skin:  Skin is warm, dry and intact. No rash noted. Psychiatric: Mood and affect are normal. Speech and behavior are normal.  ____________________________________________   LABS (all labs ordered are listed, but only abnormal results are displayed)  Labs Reviewed  BASIC METABOLIC PANEL - Abnormal; Notable for the following components:      Result Value   Glucose, Bld 121 (*)    All other components within normal limits  CBC - Abnormal; Notable for the following components:   Hemoglobin 8.3 (*)    HCT 28.2 (*)    MCV 62.7 (*)    MCH 18.4 (*)    MCHC 29.4 (*)    RDW 20.2 (*)    All other components within normal limits  ETHANOL  POC URINE PREG, ED  TROPONIN I (HIGH SENSITIVITY)  TROPONIN I (HIGH SENSITIVITY)   ____________________________________________  EKG  EKG read interpreted by me shows normal sinus rhythm at 87 normal axis prolonged QT, QTC is 507 ms ____________________________________________  RADIOLOGY  ED MD interpretation: Chest x-ray read by radiology reviewed by me shows no acute disease  Official  radiology report(s): Dg Chest 2 View  Result Date: 09/20/2018 CLINICAL DATA:  Chest pain and vomiting. EXAM: CHEST - 2 VIEW COMPARISON:  05/11/2017 FINDINGS: The heart size and mediastinal contours are within normal limits. Both lungs are clear. The visualized skeletal structures are unremarkable. IMPRESSION: No active cardiopulmonary disease. Electronically Signed   By: Elberta Fortisaniel  Boyle M.D.   On: 09/20/2018 19:16    ____________________________________________   PROCEDURES  Procedure(s) performed (including Critical Care): Oral consent obtained.  A little pain medicine was given IV.  Ring block was done around the abscess in the right axilla after it had been cleaned with Betadine.  Spray cooling was done and then abscess was I&D with #11  blade about 10 cc of pus was expressed and about 3 cc of hardened pussy material was pulled out as well.  The wound was irrigated with dilute Betadine water mixture and then with lidocaine and packed with Nu Gauze.  Patient tolerated well.  Procedures   ____________________________________________   INITIAL IMPRESSION / ASSESSMENT AND PLAN / ED COURSE  Patient's troponin was negative EKG looks okay although that she does have a prolonged QT and I flipped T in V3.  I will have her follow-up with primary care and with surgery.  She will return here in 2 days for dressing change and repacking and take oxycodone and Augmentin.        \      ____________________________________________   FINAL CLINICAL IMPRESSION(S) / ED DIAGNOSES  Final diagnoses:  Abscess of right axilla     ED Discharge Orders         Ordered    amoxicillin-clavulanate (AUGMENTIN) 875-125 MG tablet  2 times daily     09/21/18 0008    oxyCODONE-acetaminophen (PERCOCET) 5-325 MG tablet  Every 4 hours PRN     09/21/18 0008           Note:  This document was prepared using Dragon voice recognition software and may include unintentional dictation errors.    Nena Polio, MD 09/21/18 [phone]

## 2018-09-21 NOTE — ED Notes (Addendum)
Into room to discharge pt, pt had taken gloves, all gauze and tape in top drawer of room. Pt informed that she cannot take supplies from hospital without being offered to her. Pt informed if she needs further dressing supplies due to inablity to afford them "all you need to do is ask me". Pt supplied with tape and additional dressings. Pt has a ride named Juanda Crumble that I spoke to who is coming to pick pt up in approx 10 minutes.

## 2018-09-21 NOTE — ED Notes (Signed)
Wound dressed with abd pad and kerlix dressing. Pt provided with additional dressing supply.

## 2018-09-21 NOTE — Discharge Instructions (Addendum)
Take the Augmentin 1 pill twice a day.  That the antibiotic it should help control infection.  Make sure you take it with food.  Sometimes children can get bad diarrhea with it if they do not eat with it.  Adults usually do not have too much trouble.  Take the Percocet 1 pill 4 times a day as needed for pain.  Come back in 2 days for a dressing change and repacking of the abscess.  Be sure to bring that bottle with a gauze that I showed you.  Take a pain pill before you come in if you have to wait for couple hours before you get seen take another one.  Please follow-up with surgery.  Dr. Perrin Maltese is on call.  There is supposed to be a way for them to see you without insurance.  Return for any increasing pain redness or swelling.  Do not shave your underarms anymore.  Do not use deodorant or antiperspirant.  You can keep the odor down by using rubbing alcohol 2 or 3 times a day or you can use ethyl alcohol most easily found in [place] with the same frequency.  Do not drink the Everclear just apply to your underarms.  Also please follow-up with primary care.  You can see [employer] charity care or the [facility] clinic or the [facility] or the [place] clinic or [employer] care.  Your EKG shows something called a prolonged QT.  You need to follow-up with primary care for that.

## 2018-09-22 ENCOUNTER — Encounter: Payer: Self-pay | Admitting: Emergency Medicine

## 2018-09-22 ENCOUNTER — Emergency Department
Admission: EM | Admit: 2018-09-22 | Discharge: 2018-09-22 | Disposition: A | Discharge status: Home / Self Care | Payer: Self-pay | Attending: Emergency Medicine | Admitting: Emergency Medicine

## 2018-09-22 ENCOUNTER — Other Ambulatory Visit: Payer: Self-pay

## 2018-09-22 DIAGNOSIS — I10 Essential (primary) hypertension: Secondary | ICD-10-CM | POA: Insufficient documentation

## 2018-09-22 DIAGNOSIS — Z79899 Other long term (current) drug therapy: Secondary | ICD-10-CM | POA: Insufficient documentation

## 2018-09-22 DIAGNOSIS — F17210 Nicotine dependence, cigarettes, uncomplicated: Secondary | ICD-10-CM | POA: Insufficient documentation

## 2018-09-22 DIAGNOSIS — Z4801 Encounter for change or removal of surgical wound dressing: Secondary | ICD-10-CM | POA: Insufficient documentation

## 2018-09-22 DIAGNOSIS — Z5189 Encounter for other specified aftercare: Secondary | ICD-10-CM

## 2018-09-22 NOTE — Discharge Instructions (Addendum)
Continue previous medications and follow discharge care instructions. °

## 2018-09-22 NOTE — ED Triage Notes (Signed)
Pt states she is here for a wound recheck and for the area to be redressed. No acute complaints expressed.

## 2018-09-22 NOTE — ED Provider Notes (Signed)
[facility] Emergency Department Provider Note   ____________________________________________   First MD Initiated Contact with Patient 09/22/18 1245     (approximate)  I have reviewed the triage vital signs and the nursing notes.   HISTORY  Chief Complaint Wound Check    HPI Rachel Leblanc is a 38 y.o. female patient presents with 2 days status post incision and drainage of axillary abscess.  Patient states decreased pain status post procedure.  Patient denies discharge dressing change this morning.  No fever associated with complaint.         Past Medical History:  Diagnosis Date  . Anemia   . Anxiety   . Bipolar disorder (HCC)   . GERD (gastroesophageal reflux disease)   . Hypertension   . Migraines   . Sleep apnea     There are no active problems to display for this patient.   Past Surgical History:  Procedure Laterality Date  . ABCESS DRAINAGE Right 08/23/15   Axilla- Dr. Everlene FarrierPabon  . CHOLECYSTECTOMY  2013   Laparoscopic  . NEPHRECTOMY Left 2006  . TUBAL LIGATION  2012    Prior to Admission medications   Medication Sig Start Date End Date Taking? Authorizing Provider  acetaminophen (TYLENOL) 325 MG tablet Take 650 mg by mouth every 6 (six) hours as needed.    [provider]  amoxicillin-clavulanate (AUGMENTIN) 875-125 MG tablet Take 1 tablet by mouth 2 (two) times daily for 10 days. 09/21/18 10/01/18  Arnaldo NatalMalinda, Paul F, MD  gabapentin (NEURONTIN) 400 MG capsule Take 400 mg by mouth 3 (three) times daily. 04/23/17   [provider]  ibuprofen (ADVIL,MOTRIN) 600 MG tablet Take 1 tablet (600 mg total) by mouth every 8 (eight) hours as needed. 07/05/17   Joni ReiningSmith, Xan Sparkman K, PA-C  loratadine (CLARITIN) 10 MG tablet Take 10 mg by mouth daily.    [provider]  omeprazole (PRILOSEC) 40 MG capsule Take 40 mg by mouth daily. 04/17/17   [provider]  oxyCODONE-acetaminophen (PERCOCET) 5-325 MG tablet Take 1  tablet by mouth every 4 (four) hours as needed for severe pain. 09/21/18 09/21/19  Arnaldo NatalMalinda, Paul F, MD  oxyCODONE-acetaminophen (PERCOCET/ROXICET) 5-325 MG tablet Take 1 tablet by mouth every 4 (four) hours as needed for severe pain. 01/25/18   Fisher, Roselyn BeringSusan W, PA-C  promethazine (PHENERGAN) 25 MG tablet Take 25 mg by mouth 3 (three) times daily as needed. 04/25/17   [provider]  tiZANidine (ZANAFLEX) 4 MG tablet Take 4 mg by mouth 3 (three) times daily as needed. 04/14/17   [provider]  traZODone (DESYREL) 150 MG tablet Take 150 mg by mouth at bedtime. 04/14/17   [provider]    Allergies Patient has no known allergies.  Family History  Problem Relation Age of Onset  . Mental illness Mother     Social History Social History   Tobacco Use  . Smoking status: Current Every Day Smoker    Packs/day: 0.50    Types: Cigarettes  . Smokeless tobacco: Never Used  Substance Use Topics  . Alcohol use: Yes    Comment: Heavy in past- Last Use 2014  . Drug use: Yes    Types: Marijuana    Review of Systems Constitutional: No fever/chills Eyes: No visual changes. ENT: No sore throat. Cardiovascular: Denies chest pain. Respiratory: Denies shortness of breath. Gastrointestinal: No abdominal pain.  No nausea, no vomiting.  No diarrhea.  No constipation. Genitourinary: Negative for dysuria. Musculoskeletal: Negative for  back pain. Skin: Negative for rash.  Right axillary abscess. Neurological: Negative for headaches, focal weakness or numbness.   ____________________________________________   PHYSICAL EXAM:  VITAL SIGNS: ED Triage Vitals  Enc Vitals Group     BP 09/22/18 1241 (!) 146/91     Pulse Rate 09/22/18 1241 68     Resp 09/22/18 1241 17     Temp 09/22/18 1241 98.5 F (36.9 C)     Temp Source 09/22/18 1241 Oral     SpO2 09/22/18 1241 100 %     Weight 09/22/18 1242 140 lb (63.5 kg)     Height 09/22/18 1242 5\' 6"  (1.676 m)     Head  Circumference --      Peak Flow --      Pain Score 09/22/18 1241 6     Pain Loc --      Pain Edu? --      Excl. in [place]? --     Constitutional: Alert and oriented. Well appearing and in no acute distress. Cardiovascular: Normal rate, regular rhythm. Grossly normal heart sounds.  Good peripheral circulation. Respiratory: Normal respiratory effort.  No retractions. Lungs CTAB. Skin: Incision site with no active draining.  Remove dressing irrigated wound with clear return.  Area was re-bandaged. Psychiatric: Mood and affect are normal. Speech and behavior are normal.  ____________________________________________   LABS (all labs ordered are listed, but only abnormal results are displayed)  Labs Reviewed - No data to display ____________________________________________  EKG   ____________________________________________  RADIOLOGY  ED MD interpretation:    Official radiology report(s): No results found.  ____________________________________________   PROCEDURES  Procedure(s) performed (including Critical Care):  Procedures   ____________________________________________   INITIAL IMPRESSION / ASSESSMENT AND PLAN / ED COURSE  As part of my medical decision making, I reviewed the following data within the [place] was evaluated in Emergency Department on 09/22/2018 for the symptoms described in the history of present illness. She was evaluated in the context of the global COVID-19 pandemic, which necessitated consideration that the patient might be at risk for infection with the SARS-CoV-2 virus that causes COVID-19. Institutional protocols and algorithms that pertain to the evaluation of patients at risk for COVID-19 are in a state of rapid change based on information released by regulatory bodies including the CDC and federal and state organizations. These policies and algorithms were followed during the patient's care in the  ED.  Patient presents for wound check secondary to incision and drainage of the right axillary abscess 2 days ago.  Wound is healing well with no active drainage.  Area was re-bandaged and patient given discharge care instruction.  Patient advised return to ED if condition worsens.  Continue previous medications.      ____________________________________________   FINAL CLINICAL IMPRESSION(S) / ED DIAGNOSES  Final diagnoses:  Wound check, abscess     ED Discharge Orders    None       Note:  This document was prepared using Dragon voice recognition software and may include unintentional dictation errors.    Sable Feil, PA-C 09/22/18 1310    Earleen Newport, MD 09/22/18 [phone]

## 2018-09-22 NOTE — ED Notes (Signed)
See triage note  Presents for wound check  States she had abscess lanced   States the packing came out  States she was just able to get her medication filled

## 2018-09-25 ENCOUNTER — Other Ambulatory Visit: Payer: Self-pay

## 2018-09-25 ENCOUNTER — Ambulatory Visit (INDEPENDENT_AMBULATORY_CARE_PROVIDER_SITE_OTHER): Payer: Self-pay | Admitting: Surgery

## 2018-09-25 ENCOUNTER — Ambulatory Visit: Payer: Self-pay | Admitting: Surgery

## 2018-09-25 ENCOUNTER — Encounter: Payer: Self-pay | Admitting: Surgery

## 2018-09-25 VITALS — BP 156/95 | HR 102 | Temp 98.2°F | Ht 66.0 in | Wt 144.8 lb

## 2018-09-25 DIAGNOSIS — L732 Hidradenitis suppurativa: Secondary | ICD-10-CM

## 2018-09-25 MED ORDER — TRAMADOL HCL 50 MG PO TABS: 50.0000 mg | ORAL_TABLET | Freq: Four times a day (QID) | ORAL | 0 refills | Status: DC | PRN

## 2018-09-25 NOTE — Patient Instructions (Addendum)
Patient's surgery to be scheduled for Friday 10/02/18 at [facility] Dr. Dahlia Byes.  The patient is aware to have COVID-19 testing done on Tuesday 09/29/18 at the [place] building drive thru ([address] [place]) between 8:00 am and 10:30 am. She is aware to isolate after, have no visitors, wash hands frequently, and avoid touching face.   The patient is aware to call the day before on 10/01/18 between 1-3 pm for her arrival time at [phone].  The patient is aware she will be contacted by the [place] to complete a phone interview sometime in the near future.  Patient aware to be NPO after midnight and have a driver.   She is aware to check in at the [place] entrance where he/she will be screened for the coronavirus and then sent to Same Day Surgery.   Patient aware that she may have no visitors and driver will need to wait in the car due to COVID-19 restrictions.   The patient verbalizes understanding of the above.   The patient is aware to call the office should she have further questions.

## 2018-09-28 ENCOUNTER — Encounter: Payer: Self-pay | Admitting: Surgery

## 2018-09-28 ENCOUNTER — Inpatient Hospital Stay
Admission: RE | Admit: 2018-09-28 | Discharge: 2018-09-28 | Disposition: A | Discharge status: Home / Self Care | Payer: Self-pay | Source: Ambulatory Visit

## 2018-09-28 NOTE — Progress Notes (Signed)
Patient ID: Rachel Leblanc, female   DOB: 21-Sep-1980, 38 y.o.   MRN: [id number]  HPI Rachel Leblanc is a 38 y.o. female in consultation at the request of Mr. Derek Mound.  He reports having some axillary pain and right lump.  Pain is moderate to severe intensity sharp in nature.  Pain is worsening which she applies any pressure.  No fevers no chills.  She did have recent visit to the urgent care and had an I&D and was placed on antibiotics.  Patient denies any fevers or chills but also denies any major improvement after antibiotic therapy.  He continues to smokes every day.  She is able to perform more than 4 METS of activity without any shortness of breath or chest pain.  He did have a chest x-ray that have personally reviewed showing no abnormalities.  A CBC revealed evidence of anemia of 8.3.  Normal white count.  BMP was completely normal.  HPI  Past Medical History:  Diagnosis Date  . Anemia   . Anxiety   . Bipolar disorder ([place])   . GERD (gastroesophageal reflux disease)   . Hypertension   . Migraines   . Sleep apnea     Past Surgical History:  Procedure Laterality Date  . ABCESS DRAINAGE Right 08/23/15   Axilla- Dr. Dahlia Byes  . CHOLECYSTECTOMY  2013   Laparoscopic  . NEPHRECTOMY Left 2006  . TUBAL LIGATION  2012    Family History  Problem Relation Age of Onset  . Mental illness Mother     Social History Social History   Tobacco Use  . Smoking status: Current Every Day Smoker    Packs/day: 0.50    Types: Cigarettes  . Smokeless tobacco: Never Used  Substance Use Topics  . Alcohol use: Yes    Comment: Heavy in past- Last Use 2014  . Drug use: Yes    Types: Marijuana    No Known Allergies  Current Outpatient Medications  Medication Sig Dispense Refill  . acetaminophen (TYLENOL) 325 MG tablet Take 650 mg by mouth every 6 (six) hours as needed.    Marland Kitchen amoxicillin-clavulanate (AUGMENTIN) 875-125 MG tablet Take 1 tablet by mouth 2 (two) times daily for 10 days. 20  tablet 0  . ibuprofen (ADVIL,MOTRIN) 600 MG tablet Take 1 tablet (600 mg total) by mouth every 8 (eight) hours as needed. 15 tablet 0  . omeprazole (PRILOSEC) 40 MG capsule Take 40 mg by mouth daily.  0  . traMADol (ULTRAM) 50 MG tablet Take 1 tablet (50 mg total) by mouth every 6 (six) hours as needed. 12 tablet 0  . gabapentin (NEURONTIN) 400 MG capsule Take 400 mg by mouth 3 (three) times daily.  0   No current facility-administered medications for this visit.      Review of Systems Full ROS  was asked and was negative except for the information on the HPI  Physical Exam Blood pressure (!) 156/95, pulse (!) 102, temperature 98.2 F (36.8 C), height 5\' 6"  (1.676 m), weight 144 lb 12.8 oz (65.7 kg), last menstrual period 09/20/2018, SpO2 99 %. CONSTITUTIONAL: NAD EYES: Pupils are equal, round, and reactive to light, Sclera are non-icteric. EARS, NOSE, MOUTH AND THROAT: The oropharynx is clear. The oral mucosa is pink and moist. Hearing is intact to voice. LYMPH NODES:  Lymph nodes in the neck are normal. RESPIRATORY:  Lungs are clear. There is normal respiratory effort, with equal breath sounds bilaterally, and without pathologic use of accessory muscles. CARDIOVASCULAR:  Heart is regular without murmurs, gallops, or rubs. GI: The abdomen is soft, nontender, and nondistended. There are no palpable masses. There is no hepatosplenomegaly. There are normal bowel sounds in all quadrants. GU: Rectal deferred.   MUSCULOSKELETAL: Normal muscle strength and tone. No cyanosis or edema.   SKIN: There is evidence of hidradenitis in the right axilla with tenderness and erythema.  There is no evidence of definitive abscess or necrotizing infection.  Is limited due to the pain  nEUROLOGIC: Motor and sensation is grossly normal. Cranial nerves are grossly intact. PSYCH:  Oriented to person, place and time. Affect is normal.  Data Reviewed  I have personally reviewed the patient's imaging, laboratory  findings and medical records.    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  DisCussed with the patient in detail about her disease process and the natural history of hidradenitis.  Given that she has failed antibiotic therapy do recommend excision in the OR and let it heal by secondary intention.  Procedure discussed with the patient in detail.  Risk, benefits and possible applications including but not limited to: Bleeding, infection, injury to adjacent structures.  She understands and wishes to proceed. A copy of this report was sent to the referring provider  Sterling Bigiego Pabon, MD FACS General Surgeon 09/28/2018, 6:14 PM

## 2018-09-29 ENCOUNTER — Other Ambulatory Visit: Admission: RE | Admit: 2018-09-29 | Payer: Self-pay | Source: Ambulatory Visit

## 2018-09-29 ENCOUNTER — Telehealth: Payer: Self-pay

## 2018-09-29 ENCOUNTER — Telehealth: Payer: Self-pay | Admitting: *Deleted

## 2018-09-29 NOTE — Telephone Encounter (Signed)
Per London Sheer with Pre-admit has not been able to reach patient to complete a phone interview.   I called patient's cell phone today and spoke with a gentleman who answered. He said the patient was not there but he had already gotten a call that the patient needs to call Judeen Hammans back in [place].   He states he will get in touch with the patient now.

## 2018-09-29 NOTE — Telephone Encounter (Signed)
Spoke with the patient and she is aware that we will have to reschedule her surgery. The patient is scheduled for surgery at [facility] with DR Pabon on 10/06/18. She will call the day before to get her arrival time. She will have her Covid-19 testing done on 10/02/18 between 8-10:30 am at the [facility] drive up testing. She is aware of date, time, and instructions.

## 2018-09-29 NOTE — Telephone Encounter (Signed)
Call to patient. The patient missed having her Covid-19 testing done today for her surgery that is scheduled for 10/02/18.  I have left a message with her boyfriend to have her call the office back.

## 2018-09-29 NOTE — Pre-Procedure Instructions (Signed)
DR PABON OFFICE NOTIFIED PATIENT LM X 3  AND NO RETURN CALL FOR TELEPHONE INTERVIEW

## 2018-09-30 ENCOUNTER — Inpatient Hospital Stay: Admission: RE | Admit: 2018-09-30 | Payer: Self-pay | Source: Ambulatory Visit

## 2018-10-02 ENCOUNTER — Other Ambulatory Visit
Admission: RE | Admit: 2018-10-02 | Discharge: 2018-10-02 | Disposition: A | Discharge status: Home / Self Care | Payer: HRSA Program | Source: Ambulatory Visit | Attending: Surgery | Admitting: Surgery

## 2018-10-02 ENCOUNTER — Other Ambulatory Visit: Payer: Self-pay

## 2018-10-02 DIAGNOSIS — Z20828 Contact with and (suspected) exposure to other viral communicable diseases: Secondary | ICD-10-CM | POA: Diagnosis present

## 2018-10-02 LAB — SARS CORONAVIRUS 2 (TAT 6-24 HRS): SARS Coronavirus 2: NEGATIVE

## 2018-10-05 MED ORDER — CLINDAMYCIN PHOSPHATE 900 MG/50ML IV SOLN: 900.0000 mg | INTRAVENOUS | Status: DC

## 2018-10-06 ENCOUNTER — Ambulatory Visit
Admission: RE | Admit: 2018-10-06 | Discharge: 2018-10-06 | Disposition: A | Discharge status: Home / Self Care | Payer: Self-pay | Attending: Surgery | Admitting: Surgery

## 2018-10-06 ENCOUNTER — Encounter
Admission: RE | Disposition: A | Discharge status: Home / Self Care | Payer: Self-pay | Source: Home / Self Care | Attending: Surgery

## 2018-10-06 ENCOUNTER — Encounter: Payer: Self-pay | Admitting: Anesthesiology

## 2018-10-06 DIAGNOSIS — L732 Hidradenitis suppurativa: Secondary | ICD-10-CM

## 2018-10-06 DIAGNOSIS — Z538 Procedure and treatment not carried out for other reasons: Secondary | ICD-10-CM | POA: Insufficient documentation

## 2018-10-06 DIAGNOSIS — R825 Elevated urine levels of drugs, medicaments and biological substances: Secondary | ICD-10-CM | POA: Insufficient documentation

## 2018-10-06 LAB — URINE DRUG SCREEN, QUALITATIVE (ARMC ONLY)
Amphetamines, Ur Screen: NOT DETECTED
Barbiturates, Ur Screen: NOT DETECTED
Benzodiazepine, Ur Scrn: NOT DETECTED
Cannabinoid 50 Ng, Ur ~~LOC~~: POSITIVE — AB
Cocaine Metabolite,Ur ~~LOC~~: POSITIVE — AB
MDMA (Ecstasy)Ur Screen: NOT DETECTED
Methadone Scn, Ur: NOT DETECTED
Opiate, Ur Screen: NOT DETECTED
Phencyclidine (PCP) Ur S: NOT DETECTED
Tricyclic, Ur Screen: NOT DETECTED

## 2018-10-06 LAB — POCT PREGNANCY, URINE: Preg Test, Ur: NEGATIVE

## 2018-10-06 SURGERY — EXCISION, HIDRADENITIS, AXILLA
Anesthesia: General | Laterality: Right

## 2018-10-06 SURGICAL SUPPLY — 23 items
BLADE SURG 15 STRL LF DISP TIS (BLADE) ×1 IMPLANT
BLADE SURG 15 STRL SS (BLADE) ×2
CANISTER SUCT 1200ML W/VALVE (MISCELLANEOUS) ×3 IMPLANT
CHLORAPREP W/TINT 26 (MISCELLANEOUS) ×3 IMPLANT
COVER WAND RF STERILE (DRAPES) ×3 IMPLANT
DERMABOND ADVANCED (GAUZE/BANDAGES/DRESSINGS) ×2
DERMABOND ADVANCED .7 DNX12 (GAUZE/BANDAGES/DRESSINGS) ×1 IMPLANT
DRAPE LAPAROTOMY TRNSV 106X77 (MISCELLANEOUS) ×3 IMPLANT
GAUZE PACKING IODOFORM 1/2 (PACKING) ×3 IMPLANT
GAUZE SPONGE 4X4 12PLY STRL (GAUZE/BANDAGES/DRESSINGS) ×3 IMPLANT
GLOVE BIO SURGEON STRL SZ7 (GLOVE) ×3 IMPLANT
GOWN STRL REUS W/ TWL LRG LVL3 (GOWN DISPOSABLE) ×2 IMPLANT
GOWN STRL REUS W/TWL LRG LVL3 (GOWN DISPOSABLE) ×4
LABEL OR SOLS (LABEL) ×3 IMPLANT
NEEDLE HYPO 22GX1.5 SAFETY (NEEDLE) ×3 IMPLANT
NS IRRIG 500ML POUR BTL (IV SOLUTION) ×3 IMPLANT
PACK BASIN MINOR ARMC (MISCELLANEOUS) ×3 IMPLANT
SPONGE LAP 18X18 RF (DISPOSABLE) ×3 IMPLANT
SUT MNCRL 4-0 (SUTURE) ×2
SUT MNCRL 4-0 27XMFL (SUTURE) ×1
SUT VIC AB 2-0 CT2 27 (SUTURE) ×6 IMPLANT
SUTURE MNCRL 4-0 27XMF (SUTURE) ×1 IMPLANT
SYR 10ML LL (SYRINGE) ×6 IMPLANT

## 2018-10-06 NOTE — Progress Notes (Signed)
During the pre procedure interview, the patient reported eating a egg sandwich at 0900 today (10/06/2018). Dr Dahlia Byes was called and informed about the patient eating at 0900 today. Dr Dahlia Byes decided to cancel the surgery and caal patient to reschedule procedure at a later time. Patient confirmed understanding of information and direction.

## 2018-10-07 ENCOUNTER — Telehealth: Payer: Self-pay | Admitting: *Deleted

## 2018-10-07 NOTE — Telephone Encounter (Signed)
Patient called the office and states that she was unable to have surgery yesterday with Dr. Dahlia Byes because she ate a salad before reporting to the hospital.   The patient wishes to reschedule surgery for 10-16-18. Patient aware to have repeat COVID testing done on 10-13-18. Also, reminded to have nothing to eat or drink after midnight prior to surgery.

## 2018-10-12 ENCOUNTER — Other Ambulatory Visit: Admission: RE | Admit: 2018-10-12 | Payer: Self-pay | Source: Ambulatory Visit

## 2018-10-13 ENCOUNTER — Other Ambulatory Visit
Admission: RE | Admit: 2018-10-13 | Discharge: 2018-10-13 | Disposition: A | Discharge status: Home / Self Care | Payer: HRSA Program | Source: Ambulatory Visit | Attending: Surgery | Admitting: Surgery

## 2018-10-13 ENCOUNTER — Other Ambulatory Visit: Payer: Self-pay

## 2018-10-13 DIAGNOSIS — Z01812 Encounter for preprocedural laboratory examination: Secondary | ICD-10-CM | POA: Diagnosis not present

## 2018-10-13 DIAGNOSIS — Z20828 Contact with and (suspected) exposure to other viral communicable diseases: Secondary | ICD-10-CM | POA: Insufficient documentation

## 2018-10-13 LAB — SARS CORONAVIRUS 2 (TAT 6-24 HRS): SARS Coronavirus 2: NEGATIVE

## 2018-10-14 ENCOUNTER — Telehealth: Payer: Self-pay | Admitting: *Deleted

## 2018-10-14 NOTE — Telephone Encounter (Signed)
-----   Message from Jules Husbands, MD sent at 10/14/2018  8:35 AM EDT ----- Please let the pt known they are covid negative, also remind her about NPO status ( she ate an egg sandwich last time.) She also needs to be clean  ( had cocaine the last time) and we will check her urine again the day of procedure  ----- Message ----- From: Interface, Lab In [place] Sent: 10/13/2018   6:43 PM EDT To: Jules Husbands, MD

## 2018-10-14 NOTE — Telephone Encounter (Signed)
Notified patient as instructed, patient pleased. Discussed importance of avoiding cocaine prior to surgery, pt agrees.

## 2018-10-16 ENCOUNTER — Other Ambulatory Visit: Payer: Self-pay

## 2018-10-16 ENCOUNTER — Encounter
Admission: RE | Disposition: A | Discharge status: Home / Self Care | Payer: Self-pay | Source: Ambulatory Visit | Attending: Surgery

## 2018-10-16 ENCOUNTER — Encounter: Payer: Self-pay | Admitting: Certified Registered Nurse Anesthetist

## 2018-10-16 ENCOUNTER — Encounter: Payer: Self-pay | Admitting: *Deleted

## 2018-10-16 ENCOUNTER — Ambulatory Visit
Admission: RE | Admit: 2018-10-16 | Discharge: 2018-10-16 | Disposition: A | Discharge status: Home / Self Care | Payer: Self-pay | Source: Ambulatory Visit | Attending: Surgery | Admitting: Surgery

## 2018-10-16 DIAGNOSIS — Z5309 Procedure and treatment not carried out because of other contraindication: Secondary | ICD-10-CM | POA: Insufficient documentation

## 2018-10-16 DIAGNOSIS — L732 Hidradenitis suppurativa: Secondary | ICD-10-CM | POA: Insufficient documentation

## 2018-10-16 LAB — URINE DRUG SCREEN, QUALITATIVE (ARMC ONLY)
Amphetamines, Ur Screen: NOT DETECTED
Barbiturates, Ur Screen: NOT DETECTED
Benzodiazepine, Ur Scrn: NOT DETECTED
Cannabinoid 50 Ng, Ur ~~LOC~~: NOT DETECTED
Cocaine Metabolite,Ur ~~LOC~~: POSITIVE — AB
MDMA (Ecstasy)Ur Screen: NOT DETECTED
Methadone Scn, Ur: NOT DETECTED
Opiate, Ur Screen: NOT DETECTED
Phencyclidine (PCP) Ur S: NOT DETECTED
Tricyclic, Ur Screen: NOT DETECTED

## 2018-10-16 LAB — POCT PREGNANCY, URINE: Preg Test, Ur: NEGATIVE

## 2018-10-16 SURGERY — EXCISION, HIDRADENITIS, AXILLA
Anesthesia: General | Laterality: Right

## 2018-10-16 MED ORDER — LIDOCAINE HCL (PF) 2 % IJ SOLN
INTRAMUSCULAR | Status: AC
  Filled 2018-10-16: qty 10

## 2018-10-16 MED ORDER — PHENYLEPHRINE HCL (PRESSORS) 10 MG/ML IV SOLN
INTRAVENOUS | Status: AC
  Filled 2018-10-16: qty 1

## 2018-10-16 MED ORDER — CHLORHEXIDINE GLUCONATE CLOTH 2 % EX PADS: 6.0000 | MEDICATED_PAD | Freq: Once | CUTANEOUS | Status: DC

## 2018-10-16 MED ORDER — CELECOXIB 200 MG PO CAPS
200.0000 mg | ORAL_CAPSULE | ORAL | Status: AC
  Administered 2018-10-16: 06:00:00 200 mg via ORAL

## 2018-10-16 MED ORDER — GABAPENTIN 300 MG PO CAPS
300.0000 mg | ORAL_CAPSULE | ORAL | Status: AC
  Administered 2018-10-16: 06:00:00 300 mg via ORAL

## 2018-10-16 MED ORDER — CLINDAMYCIN PHOSPHATE 900 MG/50ML IV SOLN: 900.0000 mg | Freq: Once | INTRAVENOUS | Status: DC

## 2018-10-16 MED ORDER — BUPIVACAINE-EPINEPHRINE (PF) 0.25% -1:200000 IJ SOLN
INTRAMUSCULAR | Status: AC
  Filled 2018-10-16: qty 30

## 2018-10-16 MED ORDER — FENTANYL CITRATE (PF) 100 MCG/2ML IJ SOLN
INTRAMUSCULAR | Status: AC
  Filled 2018-10-16: qty 2

## 2018-10-16 MED ORDER — MIDAZOLAM HCL 2 MG/2ML IJ SOLN
INTRAMUSCULAR | Status: AC
  Filled 2018-10-16: qty 2

## 2018-10-16 MED ORDER — PROPOFOL 10 MG/ML IV BOLUS
INTRAVENOUS | Status: AC
  Filled 2018-10-16: qty 20

## 2018-10-16 MED ORDER — CELECOXIB 200 MG PO CAPS
ORAL_CAPSULE | ORAL | Status: AC
  Administered 2018-10-16: 200 mg via ORAL
  Filled 2018-10-16: qty 1

## 2018-10-16 MED ORDER — CLINDAMYCIN PHOSPHATE 900 MG/50ML IV SOLN
INTRAVENOUS | Status: AC
  Filled 2018-10-16: qty 50

## 2018-10-16 MED ORDER — LACTATED RINGERS IV SOLN: INTRAVENOUS | Status: DC

## 2018-10-16 MED ORDER — GABAPENTIN 300 MG PO CAPS
ORAL_CAPSULE | ORAL | Status: AC
  Administered 2018-10-16: 06:00:00 300 mg via ORAL
  Filled 2018-10-16: qty 1

## 2018-10-16 MED ORDER — GLYCOPYRROLATE 0.2 MG/ML IJ SOLN
INTRAMUSCULAR | Status: AC
  Filled 2018-10-16: qty 1

## 2018-10-16 MED ORDER — ACETAMINOPHEN 500 MG PO TABS
1000.0000 mg | ORAL_TABLET | ORAL | Status: AC
  Administered 2018-10-16: 1000 mg via ORAL

## 2018-10-16 MED ORDER — ACETAMINOPHEN 500 MG PO TABS
ORAL_TABLET | ORAL | Status: AC
  Administered 2018-10-16: 1000 mg via ORAL
  Filled 2018-10-16: qty 2

## 2018-10-16 MED ORDER — FAMOTIDINE 20 MG PO TABS
ORAL_TABLET | ORAL | Status: AC
  Administered 2018-10-16: 20 mg
  Filled 2018-10-16: qty 1

## 2018-10-16 SURGICAL SUPPLY — 23 items
BLADE SURG 15 STRL LF DISP TIS (BLADE) IMPLANT
BLADE SURG 15 STRL SS (BLADE)
CANISTER SUCT 1200ML W/VALVE (MISCELLANEOUS) IMPLANT
CHLORAPREP W/TINT 26 (MISCELLANEOUS) IMPLANT
COVER WAND RF STERILE (DRAPES) IMPLANT
DERMABOND ADVANCED (GAUZE/BANDAGES/DRESSINGS)
DERMABOND ADVANCED .7 DNX12 (GAUZE/BANDAGES/DRESSINGS) IMPLANT
DRAPE LAPAROTOMY TRNSV 106X77 (MISCELLANEOUS) IMPLANT
GAUZE PACKING IODOFORM 1/2 (PACKING) IMPLANT
GAUZE SPONGE 4X4 12PLY STRL (GAUZE/BANDAGES/DRESSINGS) IMPLANT
GLOVE BIO SURGEON STRL SZ7 (GLOVE) IMPLANT
GOWN STRL REUS W/ TWL LRG LVL3 (GOWN DISPOSABLE) IMPLANT
GOWN STRL REUS W/TWL LRG LVL3 (GOWN DISPOSABLE)
LABEL OR SOLS (LABEL) IMPLANT
NEEDLE HYPO 22GX1.5 SAFETY (NEEDLE) IMPLANT
NS IRRIG 500ML POUR BTL (IV SOLUTION) IMPLANT
PACK BASIN MINOR ARMC (MISCELLANEOUS) IMPLANT
SPONGE LAP 18X18 RF (DISPOSABLE) IMPLANT
SUT MNCRL 4-0 (SUTURE)
SUT MNCRL 4-0 27XMFL (SUTURE)
SUT VIC AB 2-0 CT2 27 (SUTURE) IMPLANT
SUTURE MNCRL 4-0 27XMF (SUTURE) IMPLANT
SYR 10ML LL (SYRINGE) IMPLANT

## 2018-10-16 NOTE — OR Nursing (Signed)
Upon arrival to [facility], patient states that she last used Cocaine on Monday, 5 days ago.  Her drug screen came back positive for Cocaine and it has been determined that she will be cancelled again.  Informed patient and instructed her to contact Dr. Barb Merino' office to reschedule and if she wants this surgery to be completed that she needs to be drug free for at least one week.  Dr. Dahlia Byes notifed of results and he agreed with above information.  Patient discharged home ambulatory.

## 2018-12-07 ENCOUNTER — Emergency Department
Admission: EM | Admit: 2018-12-07 | Discharge: 2018-12-07 | Disposition: A | Discharge status: Home / Self Care | Payer: Self-pay | Attending: Emergency Medicine | Admitting: Emergency Medicine

## 2018-12-07 ENCOUNTER — Emergency Department: Payer: Self-pay

## 2018-12-07 ENCOUNTER — Other Ambulatory Visit: Payer: Self-pay

## 2018-12-07 DIAGNOSIS — I10 Essential (primary) hypertension: Secondary | ICD-10-CM | POA: Insufficient documentation

## 2018-12-07 DIAGNOSIS — S8001XA Contusion of right knee, initial encounter: Secondary | ICD-10-CM | POA: Insufficient documentation

## 2018-12-07 DIAGNOSIS — Y9389 Activity, other specified: Secondary | ICD-10-CM | POA: Insufficient documentation

## 2018-12-07 DIAGNOSIS — W2209XA Striking against other stationary object, initial encounter: Secondary | ICD-10-CM | POA: Insufficient documentation

## 2018-12-07 DIAGNOSIS — Y9289 Other specified places as the place of occurrence of the external cause: Secondary | ICD-10-CM | POA: Insufficient documentation

## 2018-12-07 DIAGNOSIS — F17210 Nicotine dependence, cigarettes, uncomplicated: Secondary | ICD-10-CM | POA: Insufficient documentation

## 2018-12-07 DIAGNOSIS — Y999 Unspecified external cause status: Secondary | ICD-10-CM | POA: Insufficient documentation

## 2018-12-07 DIAGNOSIS — S93402A Sprain of unspecified ligament of left ankle, initial encounter: Secondary | ICD-10-CM | POA: Insufficient documentation

## 2018-12-07 MED ORDER — TRAMADOL HCL 50 MG PO TABS: 50.0000 mg | ORAL_TABLET | Freq: Four times a day (QID) | ORAL | 0 refills | Status: DC | PRN

## 2018-12-07 MED ORDER — MELOXICAM 15 MG PO TABS: 15.0000 mg | ORAL_TABLET | Freq: Every day | ORAL | 0 refills | Status: AC

## 2018-12-07 NOTE — Discharge Instructions (Signed)
Follow-up with your primary care provider if any continued problems or can no clinic acute care if any continued concerns.  You may use ice to your ankle and your knee.  Elevate as needed for swelling and pain.  Begin taking meloxicam 15 mg 1 daily with food.  The tramadol is the pain medication and could make you drowsy.  Take this every 6 hours if needed for pain.  Do not continue taking other peoples medication as it may interfere with medication that you were prescribed today.
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
## 2019-08-16 ENCOUNTER — Other Ambulatory Visit: Payer: Self-pay

## 2019-08-30 ENCOUNTER — Other Ambulatory Visit: Payer: Self-pay

## 2019-08-31 ENCOUNTER — Other Ambulatory Visit: Payer: Self-pay

## 2019-09-03 ENCOUNTER — Telehealth: Payer: Self-pay

## 2019-09-03 ENCOUNTER — Other Ambulatory Visit: Payer: Self-pay

## 2019-09-03 NOTE — Telephone Encounter (Signed)
Call to client to reschedule PPD appt today as agency closed on 09/06/2019.  Appt rescheduled for 09/07/2019 and client's supervisor notified via phone call per client request. Jossie Ng, RN

## 2019-09-07 ENCOUNTER — Ambulatory Visit (LOCAL_COMMUNITY_HEALTH_CENTER): Payer: Self-pay

## 2019-09-07 ENCOUNTER — Other Ambulatory Visit: Payer: Self-pay

## 2019-09-07 DIAGNOSIS — Z111 Encounter for screening for respiratory tuberculosis: Secondary | ICD-10-CM

## 2019-09-10 ENCOUNTER — Other Ambulatory Visit: Payer: Self-pay

## 2019-09-10 ENCOUNTER — Telehealth: Payer: Self-pay

## 2019-09-10 ENCOUNTER — Ambulatory Visit (LOCAL_COMMUNITY_HEALTH_CENTER): Payer: Self-pay

## 2019-09-10 DIAGNOSIS — Z111 Encounter for screening for respiratory tuberculosis: Secondary | ICD-10-CM

## 2019-09-10 LAB — TB SKIN TEST
Induration: 0 mm
TB Skin Test: NEGATIVE

## 2019-09-10 NOTE — Telephone Encounter (Signed)
[facility] as scheduled this am in Nurse Clinic. Left messge on voicemail as reminder that TB skin test due to be read today. Jossie Ng, RN

## 2020-10-04 IMAGING — CR CHEST - 2 VIEW
2 series · 2 of 2 positions shown · non-contrast
Comparison: 05/11/2017

CLINICAL DATA: Chest pain and vomiting.

EXAM:
CHEST - 2 VIEW

[chest pa]
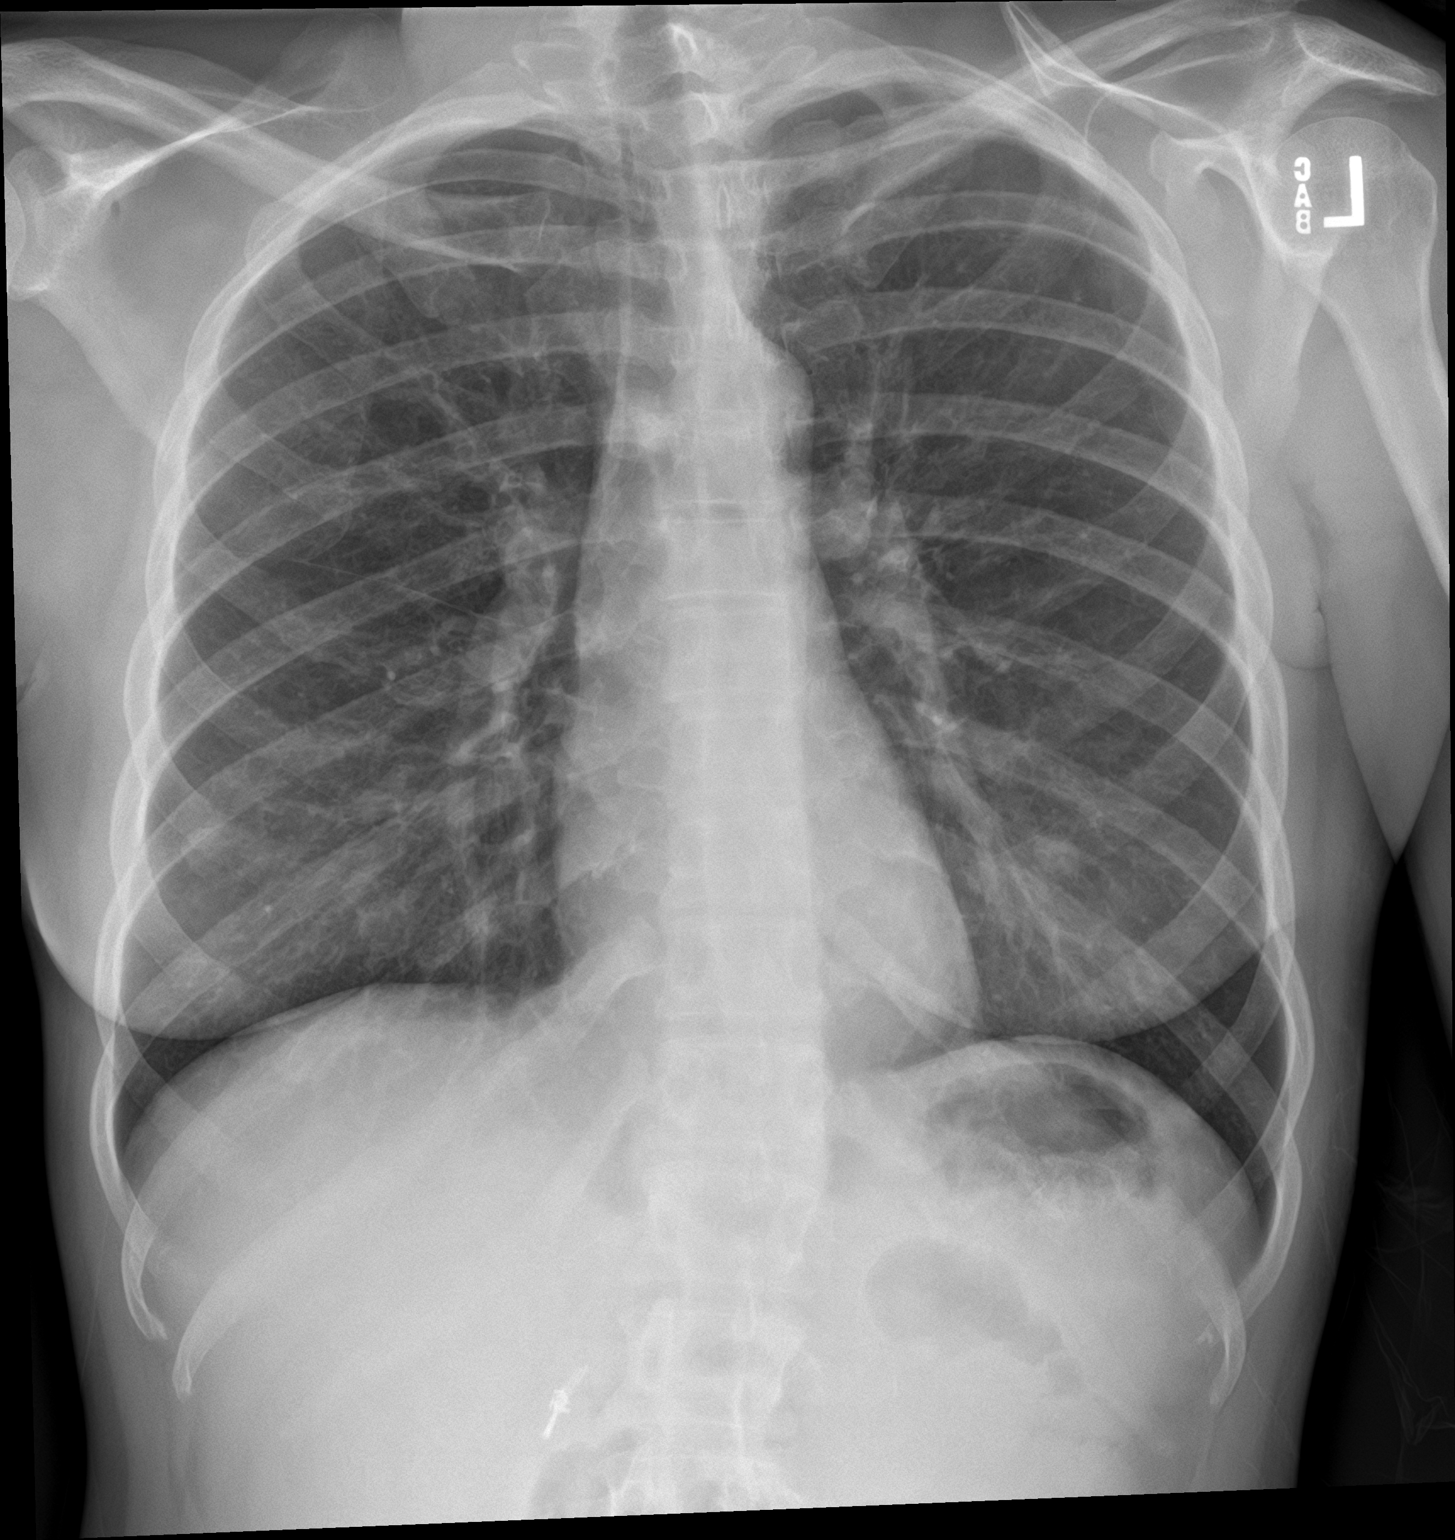

[chest lat]
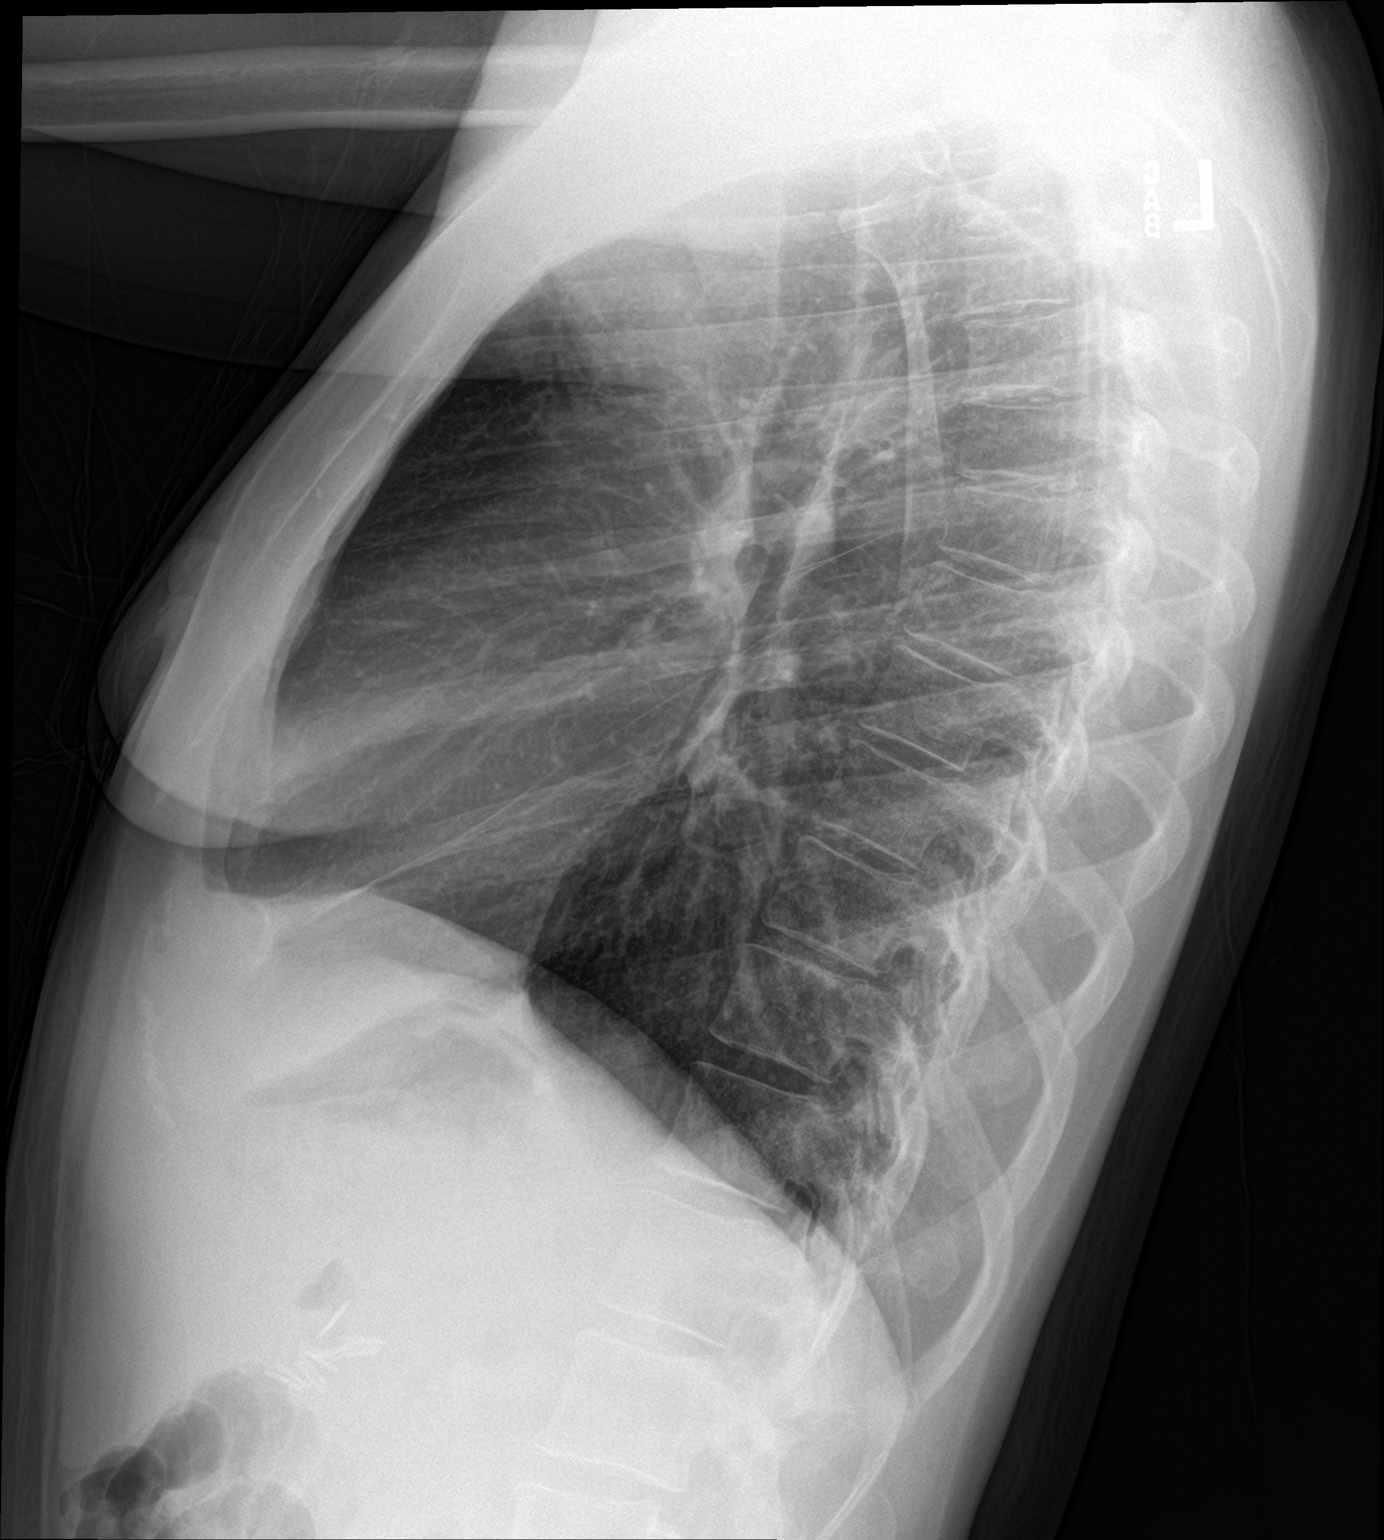

[2 of 2 positions shown; findings below may reference images not displayed]

FINDINGS: The heart size and mediastinal contours are within normal limits.
Both lungs are clear. The visualized skeletal structures are
unremarkable.
IMPRESSION: No active cardiopulmonary disease.

## 2020-11-01 ENCOUNTER — Emergency Department
Admission: EM | Admit: 2020-11-01 | Discharge: 2020-11-01 | Disposition: A | Discharge status: Home / Self Care | Payer: Self-pay | Attending: Emergency Medicine | Admitting: Emergency Medicine

## 2020-11-01 DIAGNOSIS — Z23 Encounter for immunization: Secondary | ICD-10-CM | POA: Insufficient documentation

## 2020-11-01 DIAGNOSIS — S51812A Laceration without foreign body of left forearm, initial encounter: Secondary | ICD-10-CM | POA: Insufficient documentation

## 2020-11-01 DIAGNOSIS — I10 Essential (primary) hypertension: Secondary | ICD-10-CM | POA: Insufficient documentation

## 2020-11-01 DIAGNOSIS — F17210 Nicotine dependence, cigarettes, uncomplicated: Secondary | ICD-10-CM | POA: Insufficient documentation

## 2020-11-01 DIAGNOSIS — R52 Pain, unspecified: Secondary | ICD-10-CM | POA: Diagnosis not present

## 2020-11-01 LAB — SAMPLE TO BLOOD BANK

## 2020-11-01 MED ORDER — TETANUS-DIPHTH-ACELL PERTUSSIS 5-2.5-18.5 LF-MCG/0.5 IM SUSY
0.5000 mL | PREFILLED_SYRINGE | Freq: Once | INTRAMUSCULAR | Status: AC
  Administered 2020-11-01: 0.5 mL via INTRAMUSCULAR
  Filled 2020-11-01: qty 0.5
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
## 2020-11-01 NOTE — ED Notes (Signed)
Called pts family for a ride, stated they were on the way and to notify RN when arrived
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
## 2020-11-01 NOTE — ED Notes (Addendum)
Holly Bodily (Family) contacted and stayed he would be coming to the ED to take the patient home when she is up for discharge.

## 2020-11-01 NOTE — ED Notes (Signed)
Patient resting comfortably in bed at this time. Patient waiting for ride to be discharged.

## 2021-10-06 ENCOUNTER — Emergency Department: Payer: Self-pay

## 2021-10-06 ENCOUNTER — Emergency Department
Admission: EM | Admit: 2021-10-06 | Discharge: 2021-10-07 | Discharge status: Against Medical Advice | Payer: Self-pay | Attending: Emergency Medicine | Admitting: Emergency Medicine

## 2021-10-06 ENCOUNTER — Other Ambulatory Visit: Payer: Self-pay

## 2021-10-06 ENCOUNTER — Encounter: Payer: Self-pay | Admitting: Emergency Medicine

## 2021-10-06 DIAGNOSIS — Y92002 Bathroom of unspecified non-institutional (private) residence single-family (private) house as the place of occurrence of the external cause: Secondary | ICD-10-CM | POA: Insufficient documentation

## 2021-10-06 DIAGNOSIS — R519 Headache, unspecified: Secondary | ICD-10-CM | POA: Insufficient documentation

## 2021-10-06 DIAGNOSIS — M79601 Pain in right arm: Secondary | ICD-10-CM | POA: Insufficient documentation

## 2021-10-06 DIAGNOSIS — M25539 Pain in unspecified wrist: Secondary | ICD-10-CM | POA: Insufficient documentation

## 2021-10-06 DIAGNOSIS — Y93E1 Activity, personal bathing and showering: Secondary | ICD-10-CM | POA: Insufficient documentation

## 2021-10-06 DIAGNOSIS — Z5321 Procedure and treatment not carried out due to patient leaving prior to being seen by health care provider: Secondary | ICD-10-CM | POA: Insufficient documentation

## 2021-10-06 DIAGNOSIS — W182XXA Fall in (into) shower or empty bathtub, initial encounter: Secondary | ICD-10-CM | POA: Insufficient documentation

## 2021-10-06 DIAGNOSIS — R0789 Other chest pain: Secondary | ICD-10-CM | POA: Insufficient documentation

## 2021-10-06 NOTE — ED Notes (Signed)
Pt called by triage tech, CT, xray, and this RN and registration (to cellphone, no answer) and pt was not present.
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
## 2021-10-06 NOTE — ED Triage Notes (Signed)
Patient to ED for fall- pt slipped and fell in shower last PM. C/o right sided pain- especially wrist. Patient also complaining of not being able to hear out of right ear- going on for a few days.

## 2021-10-07 ENCOUNTER — Other Ambulatory Visit: Payer: Self-pay

## 2021-10-07 ENCOUNTER — Emergency Department
Admission: EM | Admit: 2021-10-07 | Discharge: 2021-10-07 | Disposition: A | Discharge status: Home / Self Care | Payer: Medicaid Other | Attending: Emergency Medicine | Admitting: Emergency Medicine

## 2021-10-07 ENCOUNTER — Emergency Department: Payer: Medicaid Other

## 2021-10-07 ENCOUNTER — Encounter: Payer: Self-pay | Admitting: Emergency Medicine

## 2021-10-07 DIAGNOSIS — S0990XA Unspecified injury of head, initial encounter: Secondary | ICD-10-CM | POA: Insufficient documentation

## 2021-10-07 DIAGNOSIS — Z20822 Contact with and (suspected) exposure to covid-19: Secondary | ICD-10-CM | POA: Insufficient documentation

## 2021-10-07 DIAGNOSIS — W010XXA Fall on same level from slipping, tripping and stumbling without subsequent striking against object, initial encounter: Secondary | ICD-10-CM | POA: Insufficient documentation

## 2021-10-07 DIAGNOSIS — S62304A Unspecified fracture of fourth metacarpal bone, right hand, initial encounter for closed fracture: Secondary | ICD-10-CM

## 2021-10-07 DIAGNOSIS — S62344A Nondisplaced fracture of base of fourth metacarpal bone, right hand, initial encounter for closed fracture: Secondary | ICD-10-CM | POA: Insufficient documentation

## 2021-10-07 DIAGNOSIS — R059 Cough, unspecified: Secondary | ICD-10-CM | POA: Insufficient documentation

## 2021-10-07 DIAGNOSIS — N390 Urinary tract infection, site not specified: Secondary | ICD-10-CM

## 2021-10-07 DIAGNOSIS — D649 Anemia, unspecified: Secondary | ICD-10-CM | POA: Insufficient documentation

## 2021-10-07 DIAGNOSIS — Y92002 Bathroom of unspecified non-institutional (private) residence single-family (private) house as the place of occurrence of the external cause: Secondary | ICD-10-CM | POA: Insufficient documentation

## 2021-10-07 DIAGNOSIS — I10 Essential (primary) hypertension: Secondary | ICD-10-CM | POA: Insufficient documentation

## 2021-10-07 LAB — CBC WITH DIFFERENTIAL/PLATELET
Abs Immature Granulocytes: 0.03 10*3/uL (ref 0.00–0.07)
Basophils Absolute: 0 10*3/uL (ref 0.0–0.1)
Basophils Relative: 0 %
Eosinophils Absolute: 0.1 10*3/uL (ref 0.0–0.5)
Eosinophils Relative: 2 %
HCT: 27.9 % — ABNORMAL LOW (ref 36.0–46.0)
Hemoglobin: 7.9 g/dL — ABNORMAL LOW (ref 12.0–15.0)
Immature Granulocytes: 0 %
Lymphocytes Relative: 11 %
Lymphs Abs: 1 10*3/uL (ref 0.7–4.0)
MCH: 18.7 pg — ABNORMAL LOW (ref 26.0–34.0)
MCHC: 28.3 g/dL — ABNORMAL LOW (ref 30.0–36.0)
MCV: 66 fL — ABNORMAL LOW (ref 80.0–100.0)
Monocytes Absolute: 0.4 10*3/uL (ref 0.1–1.0)
Monocytes Relative: 5 %
Neutro Abs: 7.3 10*3/uL (ref 1.7–7.7)
Neutrophils Relative %: 82 %
Platelets: 286 10*3/uL (ref 150–400)
RBC: 4.23 MIL/uL (ref 3.87–5.11)
RDW: 21.5 % — ABNORMAL HIGH (ref 11.5–15.5)
Smear Review: NORMAL
WBC: 8.9 10*3/uL (ref 4.0–10.5)
nRBC: 0 % (ref 0.0–0.2)

## 2021-10-07 LAB — URINALYSIS, ROUTINE W REFLEX MICROSCOPIC
Bilirubin Urine: NEGATIVE
Glucose, UA: NEGATIVE mg/dL
Ketones, ur: NEGATIVE mg/dL
Nitrite: NEGATIVE
Protein, ur: 100 mg/dL — AB
Specific Gravity, Urine: 1.009 (ref 1.005–1.030)
WBC, UA: >50 WBC/hpf — ABNORMAL HIGH (ref 0–5)
pH: 6 (ref 5.0–8.0)

## 2021-10-07 LAB — COMPREHENSIVE METABOLIC PANEL
ALT: 17 U/L (ref 0–44)
AST: 19 U/L (ref 15–41)
Albumin: 3.6 g/dL (ref 3.5–5.0)
Alkaline Phosphatase: 46 U/L (ref 38–126)
Anion gap: 9 (ref 5–15)
BUN: 6 mg/dL (ref 6–20)
CO2: 27 mmol/L (ref 22–32)
Calcium: 8.5 mg/dL — ABNORMAL LOW (ref 8.9–10.3)
Chloride: 102 mmol/L (ref 98–111)
Creatinine, Ser: 0.85 mg/dL (ref 0.44–1.00)
GFR, Estimated: >60 mL/min (ref >60)
Glucose, Bld: 119 mg/dL — ABNORMAL HIGH (ref 70–99)
Potassium: 3 mmol/L — ABNORMAL LOW (ref 3.5–5.1)
Sodium: 138 mmol/L (ref 135–145)
Total Bilirubin: 0.5 mg/dL (ref 0.3–1.2)
Total Protein: 7 g/dL (ref 6.5–8.1)

## 2021-10-07 LAB — POC URINE PREG, ED: Preg Test, Ur: NEGATIVE

## 2021-10-07 LAB — SARS CORONAVIRUS 2 BY RT PCR: SARS Coronavirus 2 by RT PCR: NEGATIVE

## 2021-10-07 LAB — LACTIC ACID, PLASMA: Lactic Acid, Venous: 1 mmol/L (ref 0.5–1.9)

## 2021-10-07 MED ORDER — CEFDINIR 300 MG PO CAPS: 300.0000 mg | ORAL_CAPSULE | Freq: Two times a day (BID) | ORAL | 0 refills | Status: DC

## 2021-10-07 MED ORDER — HYDROCODONE-ACETAMINOPHEN 5-325 MG PO TABS
1.0000 | ORAL_TABLET | Freq: Four times a day (QID) | ORAL | 0 refills | Status: DC | PRN
Start: 2021-10-07 — End: 2022-04-15

## 2021-10-07 MED ORDER — ACETAMINOPHEN 500 MG PO TABS
1000.0000 mg | ORAL_TABLET | Freq: Once | ORAL | Status: AC
  Administered 2021-10-07: 1000 mg via ORAL
  Filled 2021-10-07: qty 2

## 2021-10-07 MED ORDER — SODIUM CHLORIDE 0.9 % IV SOLN
1.0000 g | Freq: Once | INTRAVENOUS | Status: AC
  Administered 2021-10-07: 1 g via INTRAVENOUS
  Filled 2021-10-07: qty 10

## 2021-10-07 MED ORDER — SODIUM CHLORIDE 0.9 % IV BOLUS
1000.0000 mL | Freq: Once | INTRAVENOUS | Status: AC
  Administered 2021-10-07: 1000 mL via INTRAVENOUS

## 2021-10-07 NOTE — ED Triage Notes (Signed)
Pt reports slipped in the shower and fell hurting her right wrist. Pt reports was here but the wait was too long so she left.

## 2021-10-07 NOTE — ED Notes (Signed)
Pain in head, hand and hip to the R side. Developed a cough. Didn't wait yesterday. Pt R hand wrapped at home. Pts hand is swollen.

## 2021-10-07 NOTE — ED Provider Notes (Addendum)
[facility] Provider Note    None    (approximate)   History   Fall and Hand Pain   HPI  Rachel Leblanc is a 41 y.o. female   presents to the ED with complaint of right hand pain that began yesterday after she slipped and fell in the shower.  Patient states this was an unwitnessed fall and she may have "passed out for a second".  She came to the emergency room yesterday but states the wait was too long and she left without being seen.  She also complains of an occasional cough and chills.  She denies any nausea, vomiting or diarrhea.  She is not aware of any sick contacts and was not aware that she had a fever until she was in triage.  Patient has a history of bipolar disorder, hypertension, sleep apnea, migraines, GERD and anemia.  Records show that at [facility] she has had a hemoglobin as low as 8.1 and as high as 9.4.      Physical Exam   Triage Vital Signs: ED Triage Vitals  Enc Vitals Group     BP 10/07/21 0715 (!) 152/99     Pulse Rate 10/07/21 0715 (!) 108     Resp 10/07/21 0715 20     Temp 10/07/21 0715 (!) 101.6 F (38.7 C)     Temp Source 10/07/21 0715 Oral     SpO2 10/07/21 0715 100 %     Weight 10/07/21 0714 165 lb 5.5 oz (75 kg)     Height 10/07/21 0714 5\' 6"  (1.676 m)     Head Circumference --      Peak Flow --      Pain Score 10/07/21 0714 10     Pain Loc --      Pain Edu? --      Excl. in GC? --     Most recent vital signs: Vitals:   10/07/21 0917 10/07/21 1203  BP:  (!) 148/88  Pulse:  100  Resp:  20  Temp: (!) 101.9 F (38.8 C) (!) 100.5 F (38.1 C)  SpO2:  100%     General: Awake, no distress.  CV:  Good peripheral perfusion.  Resp:  Normal effort.  Lungs are clear bilaterally. Abd:  No distention.  Soft, nontender, bowel sounds normoactive x4 quadrants. Other:  Right hand dorsal aspect with moderate soft tissue edema.  No gross deformity is noted.  Patient is able to move digits without difficulty.  Skin is intact.   Motor or sensory function intact and capillary fill is less than 3 seconds.  Radial pulse present.  No other injuries noted on examination of the upper and lower extremities.  PERRLA, EOMI's.  No tenderness noted on palpation of the scalp.   ED Results / Procedures / Treatments   Labs (all labs ordered are listed, but only abnormal results are displayed) Labs Reviewed  URINALYSIS, ROUTINE W REFLEX MICROSCOPIC - Abnormal; Notable for the following components:      Result Value   Color, Urine YELLOW (*)    APPearance CLOUDY (*)    Hgb urine dipstick MODERATE (*)    Protein, ur 100 (*)    Leukocytes,Ua LARGE (*)    WBC, UA >50 (*)    Bacteria, UA RARE (*)    Non Squamous Epithelial PRESENT (*)    All other components within normal limits  CBC WITH DIFFERENTIAL/PLATELET - Abnormal; Notable for the following components:   Hemoglobin 7.9 (*)  HCT 27.9 (*)    MCV 66.0 (*)    MCH 18.7 (*)    MCHC 28.3 (*)    RDW 21.5 (*)    All other components within normal limits  COMPREHENSIVE METABOLIC PANEL - Abnormal; Notable for the following components:   Potassium 3.0 (*)    Glucose, Bld 119 (*)    Calcium 8.5 (*)    All other components within normal limits  SARS CORONAVIRUS 2 BY RT PCR  LACTIC ACID, PLASMA  LACTIC ACID, PLASMA  POC URINE PREG, ED      RADIOLOGY Right hand x-ray images were reviewed by me and interpreted as a fracture to the base of the fourth metacarpal. Chest x-ray was negative interpreted by myself independent of the radiologist. CT head and cervical spine x-rays were negative for acute intracranial injury or cervical spine fracture per radiologist.    PROCEDURES:  Critical Care performed:   Procedures   MEDICATIONS ORDERED IN ED: Medications  acetaminophen (TYLENOL) tablet 1,000 mg (1,000 mg Oral Given 10/07/21 0746)  sodium chloride 0.9 % bolus 1,000 mL (0 mLs Intravenous Stopped 10/07/21 1203)  cefTRIAXone (ROCEPHIN) 1 g in sodium chloride 0.9 % 100 mL  IVPB (0 g Intravenous Stopped 10/07/21 1203)     IMPRESSION / MDM / ASSESSMENT AND PLAN / ED COURSE  I reviewed the triage vital signs and the nursing notes.   Differential diagnosis includes, but is not limited to, fracture right hand, contusion right hand, pneumonia, bronchitis, COVID, urinary tract infection, head injury, cervical spine fracture due to trauma.  41 year old female presents to the ED with complaint of right hand pain after an unwitnessed fall in the shower yesterday.  Patient was in the emergency department yesterday but left prior to being seen.  Right hand x-ray is suspicious for fracture with edema.  Radiology report shows fracture of the base of the fourth metacarpal.  This was placed in a OCL and patient was made aware that she cannot remove this until seen by the orthopedist.  Patient arrived with a temperature initially in triage of 101.6 that she was not aware of.  Chest x-ray was negative for pneumonia or bronchitis.  COVID test was negative.  Pregnancy test was negative, lactic acid was 1.0, CBC showed a hemoglobin of 7.9 and hematocrit 27.9.  Patient has a history of chronic anemia see above comment under history.  CMP was essentially negative.  Urinalysis did show greater than 50 WBCs, 11-20 RBCs and rare bacteria.  Patient was given Tylenol while in the ED to reduce her fever and also Rocephin 1 g IV was given while patient was in the ED.  A prescription for pain medication was sent to the pharmacy for her fractured right hand along with a prescription for Omnicef twice daily for the next 10 days.  She is strongly advised to follow-up with her PCP at [facility] for follow-up of her anemia or to return to the emergency department if any worsening of her symptoms especially nausea and vomiting if she is unable to take the antibiotic.  Patient's temp was down to 100.5 prior to discharge and patient was ambulatory without any assistance.  Patient was given information of to follow-up with  Dr. Okey Dupre who is the orthopedist on-call today.  She was again reminded that she could not take the splint off until seen by the orthopedist and to ice and elevate to reduce swelling and help with pain.      Patient's presentation is most consistent  with acute presentation with potential threat to life or bodily function.  FINAL CLINICAL IMPRESSION(S) / ED DIAGNOSES   Final diagnoses:  Closed fracture of fourth metacarpal bone of right hand, unspecified fracture morphology, initial encounter  Urinary tract infection with hematuria, site unspecified     Rx / DC Orders   ED Discharge Orders          Ordered    HYDROcodone-acetaminophen (NORCO/VICODIN) 5-325 MG tablet  Every 6 hours PRN        10/07/21 1251    cefdinir (OMNICEF) 300 MG capsule  2 times daily        10/07/21 1251             Note:  This document was prepared using Dragon voice recognition software and may include unintentional dictation errors.   Tommi Rumps, PA-C 10/07/21 1358    Tommi Rumps, PA-C 10/07/21 1359    Concha Se, MD 10/07/21 [phone]

## 2021-10-07 NOTE — Discharge Instructions (Signed)
Call make an appointment to be seen in the office by Dr. Okey Dupre who is on-call for orthopedics this weekend.  His office information and phone number listed on your discharge papers.  Leave the splint on your right hand until seen by the orthopedist.  Ice and elevation to reduce swelling which will also help with pain.  A prescription for pain medication was sent to the pharmacy to take every 6 hours as needed for pain.  Also an antibiotic to be taken twice a day for the next 10 days was sent for your urinary tract infection.  Increase fluids.  You also need to make an appointment with your primary care provider at [facility] for your chronic anemia.  Take Tylenol as needed to reduce your fever.

## 2022-02-01 DIAGNOSIS — Z419 Encounter for procedure for purposes other than remedying health state, unspecified: Secondary | ICD-10-CM | POA: Diagnosis not present

## 2022-03-04 DIAGNOSIS — Z419 Encounter for procedure for purposes other than remedying health state, unspecified: Secondary | ICD-10-CM | POA: Diagnosis not present

## 2022-04-04 DIAGNOSIS — Z419 Encounter for procedure for purposes other than remedying health state, unspecified: Secondary | ICD-10-CM | POA: Diagnosis not present

## 2022-04-15 ENCOUNTER — Ambulatory Visit (INDEPENDENT_AMBULATORY_CARE_PROVIDER_SITE_OTHER): Payer: Medicaid Other | Admitting: Physician Assistant

## 2022-04-15 ENCOUNTER — Encounter: Payer: Self-pay | Admitting: Physician Assistant

## 2022-04-15 VITALS — BP 125/97 | HR 75 | Temp 98.6°F | Ht 66.0 in | Wt 142.0 lb

## 2022-04-15 DIAGNOSIS — R1013 Epigastric pain: Secondary | ICD-10-CM | POA: Diagnosis not present

## 2022-04-15 DIAGNOSIS — F1990 Other psychoactive substance use, unspecified, uncomplicated: Secondary | ICD-10-CM | POA: Insufficient documentation

## 2022-04-15 DIAGNOSIS — H9191 Unspecified hearing loss, right ear: Secondary | ICD-10-CM

## 2022-04-15 DIAGNOSIS — R739 Hyperglycemia, unspecified: Secondary | ICD-10-CM | POA: Diagnosis not present

## 2022-04-15 DIAGNOSIS — D649 Anemia, unspecified: Secondary | ICD-10-CM | POA: Diagnosis not present

## 2022-04-15 DIAGNOSIS — Z720 Tobacco use: Secondary | ICD-10-CM

## 2022-04-15 DIAGNOSIS — Z86018 Personal history of other benign neoplasm: Secondary | ICD-10-CM | POA: Diagnosis not present

## 2022-04-15 LAB — POCT URINALYSIS DIPSTICK
Bilirubin, UA: NEGATIVE
Blood, UA: NEGATIVE
Glucose, UA: NEGATIVE
Ketones, UA: NEGATIVE
Leukocytes, UA: NEGATIVE
Nitrite, UA: NEGATIVE
Protein, UA: NEGATIVE
Spec Grav, UA: 1.02 (ref 1.010–1.025)
Urobilinogen, UA: NEGATIVE E.U./dL — AB
pH, UA: 7 (ref 5.0–8.0)

## 2022-04-15 MED ORDER — OMEPRAZOLE 20 MG PO CPDR: 20.0000 mg | DELAYED_RELEASE_CAPSULE | Freq: Every day | ORAL | 1 refills | Status: AC

## 2022-04-15 NOTE — Assessment & Plan Note (Signed)
Encouraged smoking cessation 

## 2022-04-15 NOTE — Assessment & Plan Note (Signed)
Ref back at pt preference to [facility] nephrology UA - protein , blood.  Pt is mildly hypertensive today

## 2022-04-15 NOTE — Progress Notes (Signed)
New patient visit   Patient: Rachel Leblanc   DOB: [birth year]   41 y.o. Female  MRN: [id number] Visit Date: 04/15/2022  Today's healthcare provider: Mikey Kirschner, PA-C   Cc. Establish care, hearing loss, abdominal pain  Subjective    Rachel Leblanc is a 42 y.o. female who presents today as a new patient to establish care.  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  Pt is a current weekly user of cocaine and marijuana, last use 2 days ago, smokes around 0.5 packs of cigarettes daily.   She has concerns over right ear pain and loss of hearing x 3 months. Reports it started when she was in jail 3 months ago, was put on different antibiotics with no improvement. Reports the pain comes and goes but what worries her the most is that she cannot hear. States things feel far away. Reports clear drainage on that ear. Denies bloody discharge.  Reports nasal congestion.   She also has concerns over epigastric/RUQ abdominal pain x 2 days associated with nausea. Reports making herself vomit yesterday which helped. Reports acid reflux symptoms, she has regurgitation up her nostrils at night.  Reports nocturia, "every hours on the hour" denies hematuria, dysuria.     Past Medical History:  Diagnosis Date   Anemia    Anxiety    Bipolar disorder ([place])    GERD (gastroesophageal reflux disease)    Hypertension    Migraines    Sleep apnea    should use cpap   Past Surgical History:  Procedure Laterality Date   ABCESS DRAINAGE Right 08/23/2015   Axilla- Dr. Dahlia Byes   CHOLECYSTECTOMY  2013   Laparoscopic   NEPHRECTOMY Left 2006   d/t tumor growing attached to kidney.   TUBAL LIGATION  2012   Family Status  Relation Name Status   Mother  Alive   Father  Deceased   Family History  Problem  Relation Age of Onset   Hypertension Mother    Mental illness Mother    Osteoporosis Mother    Stroke Mother    Cancer - Prostate Father    Social History   Socioeconomic History   Marital status: Single    Spouse name: Not on file   Number of children: Not on file   Years of education: Not on file   Highest education level: Not on file  Occupational History    Comment: not working  Tobacco Use   Smoking status: Every Day    Packs/day: 0.50    Types: Cigarettes   Smokeless tobacco: Never  Vaping Use   Vaping Use: Never used  Substance and Sexual Activity   Alcohol use: Yes    Comment: usually has drinks once a week   Drug use: Yes    Types: Marijuana, Cocaine    Comment: uses about once a week   Sexual activity: Not on file  Other Topics Concern   Not on file  Social History Narrative   Patient lives with cousin who will help her out after surgery   Social Determinants of Health   Financial Resource Strain: High Risk (10/16/2018)   Overall Financial Resource Strain (CARDIA)    Difficulty of Paying Living Expenses: Hard  Food Insecurity: Not on file  Transportation Needs: Not on  file  Physical Activity: Not on file  Stress: Not on file  Social Connections: Not on file   Outpatient Medications Prior to Visit  Medication Sig   traZODone (DESYREL) 100 MG tablet Take 100 mg by mouth at bedtime.   [DISCONTINUED] cefdinir (OMNICEF) 300 MG capsule Take 1 capsule (300 mg total) by mouth 2 (two) times daily. (Patient not taking: Reported on 04/15/2022)   [DISCONTINUED] HYDROcodone-acetaminophen (NORCO/VICODIN) 5-325 MG tablet Take 1 tablet by mouth every 6 (six) hours as needed for moderate pain. (Patient not taking: Reported on 04/15/2022)   No facility-administered medications prior to visit.   No Known Allergies  Immunization History  Administered Date(s) Administered   PPD Test 09/07/2019   Tdap 11/01/2020    Health Maintenance  Topic Date Due   COVID-19  Vaccine (1) Never done   HIV Screening  Never done   Hepatitis C Screening  Never done   PAP SMEAR-Modifier  Never done   INFLUENZA VACCINE  06/02/2022 (Originally 10/02/2021)   DTaP/Tdap/Td (2 - Td or Tdap) 11/02/2030   HPV VACCINES  Aged Out    Patient Care Team: Mikey Kirschner, PA-C as PCP - General (Physician Assistant)  Review of Systems  Constitutional:  Negative for fatigue and fever.  HENT:  Positive for congestion and hearing loss.   Respiratory:  Negative for cough and shortness of breath.   Cardiovascular:  Negative for chest pain and leg swelling.  Gastrointestinal:  Positive for abdominal pain, nausea and vomiting.  Neurological:  Negative for dizziness and headaches.      Objective    BP (!) 125/97 (BP Location: Right Arm, Patient Position: Sitting, Cuff Size: Normal)   Pulse 75   Temp 98.6 F (37 C)   Ht 5' 6"$  (1.676 m)   Wt 142 lb (64.4 kg)   LMP 04/04/2022   SpO2 96%   BMI 22.92 kg/m    Physical Exam Constitutional:      General: She is awake.     Appearance: She is well-developed.  HENT:     Head: Normocephalic.     Right Ear: Ear canal and external ear normal.     Left Ear: Tympanic membrane, ear canal and external ear normal.     Ears:     Comments: Right TM with scarring vs TM exudate vs suppurative material behind TM  ?      Nose: Congestion present.  Eyes:     Conjunctiva/sclera: Conjunctivae normal.  Cardiovascular:     Rate and Rhythm: Normal rate and regular rhythm.     Heart sounds: Normal heart sounds.  Pulmonary:     Effort: Pulmonary effort is normal.     Breath sounds: Normal breath sounds.  Abdominal:     Palpations: Abdomen is soft.     Tenderness: There is abdominal tenderness in the epigastric area. There is no guarding or rebound.     Comments: During my initial pass, no tenderness. When pt asked where she is tender, points to epigastric area and when pressed, then admits to tenderness  Skin:    General: Skin is warm.   Neurological:     Mental Status: She is alert and oriented to person, place, and time.  Psychiatric:        Attention and Perception: Attention normal.        Mood and Affect: Mood normal.        Speech: Speech normal.        Behavior: Behavior is cooperative.  Depression Screen    04/15/2022    3:17 PM  PHQ 2/9 Scores  PHQ - 2 Score 2  PHQ- 9 Score 11   Results for orders placed or performed in visit on 04/15/22  POCT Urinalysis Dipstick  Result Value Ref Range   Color, UA yellow    Clarity, UA clear    Glucose, UA Negative Negative   Bilirubin, UA neg    Ketones, UA neg    Spec Grav, UA 1.020 1.010 - 1.025   Blood, UA neg    pH, UA 7.0 5.0 - 8.0   Protein, UA Negative Negative   Urobilinogen, UA negative (A) 0.2 or 1.0 E.U./dL   Nitrite, UA neg    Leukocytes, UA Negative Negative   Appearance     Odor      Assessment & Plan      Problem List Items Addressed This Visit       Nervous and Auditory   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  Ref to ENT      Relevant Orders   Ambulatory referral to ENT     Other   Epigastric pain    2/2 drug use vs gastritis vs gerd Rx omeprazole 20 mg in AM on empty stomach UA neg leuk, blood      Relevant Medications   omeprazole (PRILOSEC) 20 MG capsule   Other Relevant Orders   POCT Urinalysis Dipstick (Completed)   Anemia    Will repeat cbc, iron, folate, vit b12      Relevant Orders   CBC w/Diff/Platelet   Iron, TIBC and Ferritin Panel   Vitamin B12   Folate   Hyperglycemia    Historically on lab review Given nocturia will check a1c      Relevant Orders   HgB A1c   Tobacco use    Encouraged smoking cessation      Relevant Orders   AMB Referral to [place] ([facility] Patients)   Drug use    Cocaine, smokes, and marijuana Pt admits it is a problem and has been through an IOP before. Referred to social work       Relevant Orders   AMB Referral to [facility] ([facility] Patients)   History of pheochromocytoma    Ref back at pt preference to [facility] nephrology UA - protein , blood.  Pt is mildly hypertensive today       Relevant Orders   Ambulatory referral to Nephrology     Return pending bloodwork, likely 4-8 weeks for htn.     I, Mikey Kirschner, PA-C have reviewed all documentation for this visit. The documentation on  04/15/22 for the exam, diagnosis, procedures, and orders are all accurate and complete.  Mikey Kirschner, PA-C [facility] [address] [place], [place], [address] Office: [phone] Fax: [place]

## 2022-04-15 NOTE — Assessment & Plan Note (Addendum)
2/2 drug use vs gastritis vs gerd Rx omeprazole 20 mg in AM on empty stomach UA neg leuk, blood

## 2022-04-15 NOTE — Assessment & Plan Note (Signed)
Will repeat cbc, iron, folate, vit b12

## 2022-04-15 NOTE — Assessment & Plan Note (Signed)
Cocaine, smokes, and marijuana Pt admits it is a problem and has been through an IOP before. Referred to social work

## 2022-04-15 NOTE — Assessment & Plan Note (Signed)
Historically on lab review Given nocturia will check a1c

## 2022-04-15 NOTE — Assessment & Plan Note (Signed)
Exam mostly benign appearing, some suppurative vs exudate behind TM ?  Ref to ENT

## 2022-04-16 ENCOUNTER — Other Ambulatory Visit: Payer: Self-pay | Admitting: Physician Assistant

## 2022-04-16 DIAGNOSIS — D509 Iron deficiency anemia, unspecified: Secondary | ICD-10-CM

## 2022-04-16 LAB — CBC WITH DIFFERENTIAL/PLATELET
Basophils Absolute: 0.1 10*3/uL (ref 0.0–0.2)
Basos: 1 %
EOS (ABSOLUTE): 0.2 10*3/uL (ref 0.0–0.4)
Eos: 3 %
Hematocrit: 28.2 % — ABNORMAL LOW (ref 34.0–46.6)
Hemoglobin: 7.8 g/dL — ABNORMAL LOW (ref 11.1–15.9)
Immature Grans (Abs): 0 10*3/uL (ref 0.0–0.1)
Immature Granulocytes: 0 %
Lymphocytes Absolute: 2.1 10*3/uL (ref 0.7–3.1)
Lymphs: 42 %
MCH: 18.2 pg — ABNORMAL LOW (ref 26.6–33.0)
MCHC: 27.7 g/dL — ABNORMAL LOW (ref 31.5–35.7)
MCV: 66 fL — ABNORMAL LOW (ref 79–97)
Monocytes Absolute: 0.5 10*3/uL (ref 0.1–0.9)
Monocytes: 10 %
Neutrophils Absolute: 2.2 10*3/uL (ref 1.4–7.0)
Neutrophils: 44 %
Platelets: 420 10*3/uL (ref 150–450)
RBC: 4.29 x10E6/uL (ref 3.77–5.28)
RDW: 18.3 % — ABNORMAL HIGH (ref 11.7–15.4)
WBC: 5 10*3/uL (ref 3.4–10.8)

## 2022-04-16 LAB — HEMOGLOBIN A1C
Est. average glucose Bld gHb Est-mCnc: 117 mg/dL
Hgb A1c MFr Bld: 5.7 % — ABNORMAL HIGH (ref 4.8–5.6)

## 2022-04-16 LAB — FOLATE: Folate: 9.3 ng/mL (ref >3.0)

## 2022-04-16 LAB — IRON,TIBC AND FERRITIN PANEL
Ferritin: 7 ng/mL — ABNORMAL LOW (ref 15–150)
Iron Saturation: 3 % — CL (ref 15–55)
Iron: 11 ug/dL — ABNORMAL LOW (ref 27–159)
Total Iron Binding Capacity: 399 ug/dL (ref 250–450)
UIBC: 388 ug/dL (ref 131–425)

## 2022-04-16 LAB — VITAMIN B12: Vitamin B-12: 452 pg/mL (ref 232–1245)

## 2022-04-16 NOTE — Progress Notes (Signed)
Noted! Thank you

## 2022-04-16 NOTE — Progress Notes (Signed)
I think I may have been CC on this note by accident.

## 2022-04-17 ENCOUNTER — Telehealth: Payer: Self-pay

## 2022-04-17 NOTE — Telephone Encounter (Signed)
Copied from [place] [id number]. Topic: General - Other >> Apr 17, 2022 12:25 PM Sabas Sous wrote: Reason for CRM: Pt's mother called wanting to know if the patient needs to go to [facility] for her infusion. They would prefer to go to a local office   Best contact: [phone]

## 2022-04-17 NOTE — Telephone Encounter (Signed)
Pt given lab results per notes of L. Drubel PA-C on 04/17/22. Pt verbalized understanding. Pt advised that referrals have been made. Pt fell asleep during call. Sounded impaired. Stated she does not sleep and requested a sleep study. Pt stated she had one in past but never picked up CPAP.

## 2022-04-18 NOTE — Telephone Encounter (Signed)
Patient advised referral sent to [facility] and to contact them to confirm appt at [phone]

## 2022-04-18 NOTE — Telephone Encounter (Signed)
Patient advised. Verbalized understanding. States she received a phone call from ENT office but can not remember the appointment given and would like it confirmed.

## 2022-04-19 ENCOUNTER — Telehealth: Payer: Self-pay | Admitting: Physician Assistant

## 2022-04-19 NOTE — Telephone Encounter (Signed)
Ebony calling from [facility] in [place] is calling to report that Dr. Smith Mince reviewed the pt's chart and does not feel that nephrology referral is appropriate rather pt should go to endo. Please advise [phone]

## 2022-04-23 ENCOUNTER — Telehealth: Payer: Self-pay | Admitting: *Deleted

## 2022-04-23 ENCOUNTER — Other Ambulatory Visit: Payer: Self-pay | Admitting: Physician Assistant

## 2022-04-23 DIAGNOSIS — Z86018 Personal history of other benign neoplasm: Secondary | ICD-10-CM

## 2022-04-23 NOTE — Telephone Encounter (Signed)
Left message that she has an appt tom [facility] at [facility] and she is a hematology for anemia and not cancer. Gave address and instructions from coming in. Free valet, mandatory mask and masks are available at fron desk of check in, 1 person that is over 43 years old can come with her if she wishes. Any questions call [phone]

## 2022-04-24 ENCOUNTER — Inpatient Hospital Stay: Payer: Medicaid Other

## 2022-04-24 ENCOUNTER — Inpatient Hospital Stay: Payer: Medicaid Other | Attending: Oncology | Admitting: Oncology

## 2022-04-24 NOTE — Telephone Encounter (Signed)
Notified pt cousin Nicki Reaper) regarding reasoning and referral been change to endo

## 2022-05-03 DIAGNOSIS — Z419 Encounter for procedure for purposes other than remedying health state, unspecified: Secondary | ICD-10-CM | POA: Diagnosis not present

## 2022-05-08 ENCOUNTER — Inpatient Hospital Stay: Payer: Medicaid Other | Admitting: Oncology

## 2022-05-08 ENCOUNTER — Inpatient Hospital Stay: Payer: Medicaid Other

## 2022-05-10 ENCOUNTER — Telehealth: Payer: Self-pay | Admitting: *Deleted

## 2022-05-10 NOTE — Telephone Encounter (Signed)
Nurse placed call to patient to review appointment details for upcoming new hematology consultation visit. Left vm for patient to return call with any questions regarding upcoming new patient appointment.

## 2022-05-13 ENCOUNTER — Inpatient Hospital Stay: Payer: Medicaid Other

## 2022-05-13 ENCOUNTER — Inpatient Hospital Stay: Payer: Medicaid Other | Admitting: Internal Medicine

## 2022-05-14 ENCOUNTER — Inpatient Hospital Stay: Payer: Medicaid Other

## 2022-05-14 ENCOUNTER — Inpatient Hospital Stay: Payer: Medicaid Other | Admitting: Internal Medicine

## 2022-05-28 ENCOUNTER — Ambulatory Visit: Payer: Medicaid Other | Admitting: Physician Assistant

## 2022-06-03 DIAGNOSIS — Z419 Encounter for procedure for purposes other than remedying health state, unspecified: Secondary | ICD-10-CM | POA: Diagnosis not present

## 2022-07-03 DIAGNOSIS — Z419 Encounter for procedure for purposes other than remedying health state, unspecified: Secondary | ICD-10-CM | POA: Diagnosis not present

## 2022-08-03 DIAGNOSIS — Z419 Encounter for procedure for purposes other than remedying health state, unspecified: Secondary | ICD-10-CM | POA: Diagnosis not present

## 2022-09-02 DIAGNOSIS — Z419 Encounter for procedure for purposes other than remedying health state, unspecified: Secondary | ICD-10-CM | POA: Diagnosis not present

## 2022-09-12 ENCOUNTER — Telehealth: Payer: Self-pay

## 2022-09-12 NOTE — Telephone Encounter (Signed)
Chart review completed for patient. Patient is due for cervical cancer screen. Mychart message sent to patient to inquire about scheduling.  Nkosi Cortright, Population Health Specialist.   

## 2022-10-03 DIAGNOSIS — Z419 Encounter for procedure for purposes other than remedying health state, unspecified: Secondary | ICD-10-CM | POA: Diagnosis not present

## 2022-11-03 DIAGNOSIS — Z419 Encounter for procedure for purposes other than remedying health state, unspecified: Secondary | ICD-10-CM | POA: Diagnosis not present

## 2022-12-03 DIAGNOSIS — Z419 Encounter for procedure for purposes other than remedying health state, unspecified: Secondary | ICD-10-CM | POA: Diagnosis not present

## 2023-01-03 DIAGNOSIS — Z419 Encounter for procedure for purposes other than remedying health state, unspecified: Secondary | ICD-10-CM | POA: Diagnosis not present

## 2023-02-02 DIAGNOSIS — Z419 Encounter for procedure for purposes other than remedying health state, unspecified: Secondary | ICD-10-CM | POA: Diagnosis not present

## 2023-03-16 DIAGNOSIS — Z419 Encounter for procedure for purposes other than remedying health state, unspecified: Secondary | ICD-10-CM | POA: Diagnosis not present

## 2023-04-05 DIAGNOSIS — Z419 Encounter for procedure for purposes other than remedying health state, unspecified: Secondary | ICD-10-CM | POA: Diagnosis not present

## 2023-05-03 DIAGNOSIS — Z419 Encounter for procedure for purposes other than remedying health state, unspecified: Secondary | ICD-10-CM | POA: Diagnosis not present

## 2023-06-14 DIAGNOSIS — Z419 Encounter for procedure for purposes other than remedying health state, unspecified: Secondary | ICD-10-CM | POA: Diagnosis not present

## 2023-07-10 DIAGNOSIS — D649 Anemia, unspecified: Secondary | ICD-10-CM | POA: Diagnosis not present

## 2023-07-10 DIAGNOSIS — R030 Elevated blood-pressure reading, without diagnosis of hypertension: Secondary | ICD-10-CM | POA: Diagnosis not present

## 2023-07-10 DIAGNOSIS — F419 Anxiety disorder, unspecified: Secondary | ICD-10-CM | POA: Diagnosis not present

## 2023-07-14 DIAGNOSIS — Z419 Encounter for procedure for purposes other than remedying health state, unspecified: Secondary | ICD-10-CM | POA: Diagnosis not present

## 2023-07-24 DIAGNOSIS — D649 Anemia, unspecified: Secondary | ICD-10-CM | POA: Diagnosis not present

## 2023-07-24 DIAGNOSIS — R350 Frequency of micturition: Secondary | ICD-10-CM | POA: Diagnosis not present

## 2023-07-24 DIAGNOSIS — M79601 Pain in right arm: Secondary | ICD-10-CM | POA: Diagnosis not present

## 2023-07-24 DIAGNOSIS — G4730 Sleep apnea, unspecified: Secondary | ICD-10-CM | POA: Diagnosis not present
